# Patient Record
Sex: Male | Born: 1935 | Race: White | Hispanic: No | Smoking: Former smoker
Health system: Southern US, Community
[De-identification: ages and names within clinical notes are randomized; demographics above are authoritative.]

## PROBLEM LIST (undated history)

## (undated) DIAGNOSIS — I739 Peripheral vascular disease, unspecified: Secondary | ICD-10-CM

## (undated) DIAGNOSIS — M79604 Pain in right leg: Secondary | ICD-10-CM

## (undated) DIAGNOSIS — E119 Type 2 diabetes mellitus without complications: Secondary | ICD-10-CM

## (undated) DIAGNOSIS — I8390 Asymptomatic varicose veins of unspecified lower extremity: Secondary | ICD-10-CM

## (undated) DIAGNOSIS — R600 Localized edema: Secondary | ICD-10-CM

## (undated) DIAGNOSIS — M109 Gout, unspecified: Secondary | ICD-10-CM

## (undated) DIAGNOSIS — I779 Disorder of arteries and arterioles, unspecified: Secondary | ICD-10-CM

## (undated) DIAGNOSIS — K219 Gastro-esophageal reflux disease without esophagitis: Secondary | ICD-10-CM

## (undated) DIAGNOSIS — I4891 Unspecified atrial fibrillation: Secondary | ICD-10-CM

## (undated) DIAGNOSIS — M4800 Spinal stenosis, site unspecified: Secondary | ICD-10-CM

## (undated) DIAGNOSIS — M79605 Pain in left leg: Secondary | ICD-10-CM

## (undated) DIAGNOSIS — J449 Chronic obstructive pulmonary disease, unspecified: Secondary | ICD-10-CM

## (undated) DIAGNOSIS — I2510 Atherosclerotic heart disease of native coronary artery without angina pectoris: Secondary | ICD-10-CM

## (undated) DIAGNOSIS — N289 Disorder of kidney and ureter, unspecified: Secondary | ICD-10-CM

## (undated) DIAGNOSIS — N62 Hypertrophy of breast: Secondary | ICD-10-CM

## (undated) DIAGNOSIS — E785 Hyperlipidemia, unspecified: Secondary | ICD-10-CM

## (undated) HISTORY — DX: Unspecified atrial fibrillation: I48.91

## (undated) HISTORY — DX: Type 2 diabetes mellitus without complications: E11.9

## (undated) HISTORY — DX: Atherosclerotic heart disease of native coronary artery without angina pectoris: I25.10

## (undated) HISTORY — PX: EYE SURGERY: SHX253

## (undated) HISTORY — DX: Spinal stenosis, site unspecified: M48.00

## (undated) HISTORY — DX: Asymptomatic varicose veins of unspecified lower extremity: I83.90

## (undated) HISTORY — DX: Gastro-esophageal reflux disease without esophagitis: K21.9

## (undated) HISTORY — PX: CHOLECYSTECTOMY: SHX55

## (undated) HISTORY — DX: Disorder of arteries and arterioles, unspecified: I77.9

## (undated) HISTORY — DX: Chronic obstructive pulmonary disease, unspecified: J44.9

## (undated) HISTORY — DX: Gout, unspecified: M10.9

## (undated) HISTORY — DX: Hyperlipidemia, unspecified: E78.5

## (undated) HISTORY — DX: Pain in right leg: M79.604

## (undated) HISTORY — DX: Localized edema: R60.0

## (undated) HISTORY — DX: Pain in left leg: M79.605

## (undated) HISTORY — PX: HERNIA REPAIR: SHX51

## (undated) HISTORY — DX: Peripheral vascular disease, unspecified: I73.9

## (undated) HISTORY — DX: Disorder of kidney and ureter, unspecified: N28.9

## (undated) HISTORY — DX: Hypertrophy of breast: N62

---

## 2001-01-15 ENCOUNTER — Ambulatory Visit (HOSPITAL_COMMUNITY): Admission: RE | Admit: 2001-01-15 | Discharge: 2001-01-15 | Payer: Self-pay | Admitting: Cardiology

## 2005-02-06 ENCOUNTER — Ambulatory Visit: Payer: Self-pay | Admitting: Cardiology

## 2005-05-08 ENCOUNTER — Ambulatory Visit: Payer: Self-pay | Admitting: Cardiology

## 2005-05-27 ENCOUNTER — Ambulatory Visit: Payer: Self-pay | Admitting: Cardiology

## 2005-10-06 ENCOUNTER — Ambulatory Visit: Payer: Self-pay | Admitting: Cardiology

## 2006-02-18 ENCOUNTER — Ambulatory Visit: Payer: Self-pay | Admitting: Cardiology

## 2006-03-03 ENCOUNTER — Ambulatory Visit: Payer: Self-pay | Admitting: Cardiology

## 2006-03-04 ENCOUNTER — Ambulatory Visit: Payer: Self-pay | Admitting: Cardiology

## 2006-03-11 ENCOUNTER — Ambulatory Visit: Payer: Self-pay | Admitting: Cardiology

## 2006-03-20 ENCOUNTER — Ambulatory Visit: Payer: Self-pay | Admitting: Cardiology

## 2007-03-01 ENCOUNTER — Ambulatory Visit: Payer: Self-pay | Admitting: Cardiology

## 2007-03-19 ENCOUNTER — Ambulatory Visit: Payer: Self-pay | Admitting: Cardiology

## 2007-03-26 ENCOUNTER — Ambulatory Visit: Payer: Self-pay | Admitting: Cardiology

## 2007-04-26 ENCOUNTER — Ambulatory Visit: Payer: Self-pay | Admitting: Cardiology

## 2007-06-10 ENCOUNTER — Ambulatory Visit: Payer: Self-pay | Admitting: Cardiology

## 2007-09-20 ENCOUNTER — Ambulatory Visit: Payer: Self-pay | Admitting: Cardiology

## 2009-02-14 ENCOUNTER — Encounter: Payer: Self-pay | Admitting: Cardiology

## 2009-03-08 ENCOUNTER — Encounter: Payer: Self-pay | Admitting: Cardiology

## 2009-04-04 ENCOUNTER — Ambulatory Visit: Payer: Self-pay | Admitting: Cardiology

## 2009-04-11 ENCOUNTER — Encounter: Payer: Self-pay | Admitting: Physician Assistant

## 2009-06-13 ENCOUNTER — Encounter: Payer: Self-pay | Admitting: Cardiology

## 2009-09-23 ENCOUNTER — Encounter: Payer: Self-pay | Admitting: Cardiology

## 2009-09-24 ENCOUNTER — Ambulatory Visit: Payer: Self-pay | Admitting: Cardiology

## 2009-09-25 ENCOUNTER — Encounter: Payer: Self-pay | Admitting: Cardiology

## 2009-09-27 ENCOUNTER — Encounter: Payer: Self-pay | Admitting: Cardiology

## 2010-08-07 ENCOUNTER — Ambulatory Visit: Payer: Self-pay | Admitting: Cardiology

## 2010-08-07 DIAGNOSIS — K219 Gastro-esophageal reflux disease without esophagitis: Secondary | ICD-10-CM

## 2010-08-07 DIAGNOSIS — N62 Hypertrophy of breast: Secondary | ICD-10-CM

## 2010-08-07 DIAGNOSIS — E119 Type 2 diabetes mellitus without complications: Secondary | ICD-10-CM

## 2010-08-13 ENCOUNTER — Encounter: Payer: Self-pay | Admitting: Cardiology

## 2010-08-16 ENCOUNTER — Encounter (INDEPENDENT_AMBULATORY_CARE_PROVIDER_SITE_OTHER): Payer: Self-pay | Admitting: *Deleted

## 2010-10-29 NOTE — Letter (Signed)
Summary: Engineer, materials at [facility]  [address]   [place], [place] [address]   Phone: [phone]  Fax: [phone]        August 16, 2010 MRN: [id number]    Reginald [name] [address] [place], [place]  [address]    Dear [name],  Your test ordered by Selena Batten has been reviewed by your physician (or physician assistant) and was found to be normal or stable. Your physician (or physician assistant) felt no changes were needed at this time.  ____ Echocardiogram  ____ Cardiac Stress Test  ____ Lab Work  _X___ Peripheral vascular study of neck  ____ CT scan or X-ray  ____ Lung or Breathing test  ____ Other:   Thank you.   Cyril Loosen, RN, BSN    Duane Boston, M.D., F.A.C.C. Thressa Sheller, M.D., F.A.C.C. Oneal Grout, M.D., F.A.C.C. Cheree Ditto, M.D., F.A.C.C. Daiva Nakayama, M.D., F.A.C.C. Kenney Houseman, M.D., F.A.C.C. Jeanne Ivan, PA-C

## 2010-10-29 NOTE — Assessment & Plan Note (Signed)
Summary: 77 YR FUL   Visit Type:  Follow-up Primary Provider:  Lita Mains  CC:  CAD.  History of Present Illness: Invasion is seen for cardiology followup.  I saw him last in July, 77 2010.  He has known coronary disease.  He has total occlusion of his proximal LAD.  He has had no ischemia.  Is not having any significant chest pain.  He is feeling well.  I have reviewed all of his medical chart.  The patient does have carotid disease.  I see now that he has not had a carotid Doppler since 2006.  This will need to be repeated.  Preventive Screening-Counseling & Management  Alcohol-Tobacco     Smoking Status: quit     Year Quit: 07/2009  Current Medications (verified): 1)  Allopurinol 100 Mg Tabs (Allopurinol) .... Take 1 Tablet By Mouth Once A Day 2)  Furosemide 40 Mg Tabs (Furosemide) .... Take 1 Tablet By Mouth Two Times A Day 3)  Glyburide 2.5 Mg Tabs (Glyburide) .... Take 1 Tablet By Mouth Every Morning 4)  Lisinopril 10 Mg Tabs (Lisinopril) .... Take 1 Tablet By Mouth Once A Day 5)  Metoprolol Tartrate 100 Mg Tabs (Metoprolol Tartrate) .... One Tablet By Mouth Twice A Day 6)  Potassium Chloride Crys Cr 20 Meq Cr-Tabs (Potassium Chloride Crys Cr) .... Take One Tablet By Mouth Daily 7)  Aspirin 81 Mg Tabs (Aspirin) .... Take 1 Tablet By Mouth Once A Day 8)  Fish Oil 1000 Mg Caps (Omega-3 Fatty Acids) .... Take 1 Tablet By Mouth Three Times A Day 9)  Isosorbide Mononitrate Cr 60 Mg Xr24h-Tab (Isosorbide Mononitrate) .... Take 1 Tablet By Mouth Once A Day 10)  Tricor 145 Mg Tabs (Fenofibrate) .... Take 1 Tablet By Mouth Once A Day 11)  Multivitamins  Tabs (Multiple Vitamin) .... Take 1 Tablet By Mouth Once A Day 12)  Oscal 500/200 D-3 500-200 Mg-Unit Tabs (Calcium Carbonate-Vitamin D) .... Take 1 Tablet By Mouth Two Times A Day 13)  Zantac 150 Mg Tabs (Ranitidine Hcl) .... Take 1 Tablet By Mouth Two Times A Day 14)  Warfarin Sodium 5 Mg Tabs (Warfarin Sodium) .... Use As Directed Per  [facility] 15)  Colestipol Hcl 1 Gm Tabs (Colestipol Hcl) .... Take 1 Tablet By Mouth Two Times A Day 16)  Welchol 3.75 Gm Pack (Colesevelam Hcl) .... Dissolve One Packet in [employer] Daily  Allergies (verified): 1)  ! Morphine  Comments:  Nurse/Medical Assistant: The patient's medication bottles and allergies were reviewed with the patient and were updated in the Medication and Allergy Lists.  Past History:  Past Medical History: GERD CAD    catheterization 2002, total LAD.Marland Kitchen. single-vessel disease C O P D Gouty arthritis Spinal stenosis Tobacco abuse Dyslipidemia.. Renal insufficiency Diabetes  Gynecomastia   chronic Edema   lower extremity... chronic  Asthma EF 55-60% echo May, 2008 ( EF had been 35% previously)... stress echo 2009.. no definite ischemia.. question of increased LVOT velocity of 3 m/s not recorded RICA, 60-79% LICA Carotid artery disease  40-59% RICA, 60-79% LICA  ( 2006)  Social History: Smoking Status:  quit  Review of Systems       Patient denies fever, chills, headache, sweats, rash, change in vision, change in hearing, cough, nausea vomiting, urinary symptoms.  All other systems are reviewed and are negative.  Vital Signs:  Patient profile:   77 year old male Height:      72 inches Weight:  220 pounds BMI:     29.95 Pulse rate:   77 / minute BP sitting:   102 / 64  (left arm) Cuff size:   large  Vitals Entered By: Carlye Grippe (August 07, 2010 10:31 AM)  Nutrition Counseling: Patient's BMI is greater than 25 and therefore counseled on weight management options.  Physical Exam  General:  patient is stable today. Head:  head is atraumatic. Eyes:  no xanthelasma. Neck:  no jugular venous distention. Chest Wall:  no chest wall tenderness. Lungs:  lungs are clear respiratory effort is not labored. Heart:  cardiac exam reveals an S1-S2.  No clicks or significant murmurs. Abdomen:  abdomen is soft. Msk:  no musculoskeletal  deformities. Extremities:  no peripheral edema. Skin:  no skin rashes. Psych:  patient is oriented to person time and place.  Affect is normal.   Impression & Recommendations:  Problem # 1:  CAROTID ARTERY DISEASE (ICD-433.10) review of records show that the patient has significant carotid disease. I am scheduling him for a followup carotid Doppler.  Problem # 2:  * QUESTION LEFT VENTRICULAR OUTFLOW INCREASED VELOCITY There was question of an increased velocity at the time of his last echo.  However the study was technically difficult.  He does not need a followup echo at this time but will be kept in mind.  Problem # 3:  EDEMA (ICD-782.3) There is no significant edema at this time.  No further workup.  Problem # 4:  DYSLIPIDEMIA (ICD-272.4) Lipids are treated.  No change in therapy. The following medications were removed from the medication list:    Simvastatin 80 Mg Tabs (Simvastatin) .Marland Kitchen... Take one tablet by mouth daily at bedtime His updated medication list for this problem includes:    Tricor 145 Mg Tabs (Fenofibrate) .Marland Kitchen... Take 1 tablet by mouth once a day    Colestipol Hcl 1 Gm Tabs (Colestipol hcl) .Marland Kitchen... Take 1 tablet by mouth two times a day    Welchol 3.75 Gm Pack (Colesevelam hcl) .Marland Kitchen... Dissolve one packet in 4-8oz of water daily  Problem # 5:  TOBACCO ABUSE (ICD-305.1) The patient stopped smoking in 2010.  Problem # 6:  CAD (ICD-414.00) Coronary disease is stable.  EKG is done today and reviewed by me.  There is sinus rhythm.  There is no significant change.  No further cardiac workup is needed.  Carotid Doppler will be scheduled in all seen back in one year for followup.  Other Orders: EKG w/ Interpretation (93000) Carotid Duplex (Carotid Duplex)  Patient Instructions: 1)  Your physician wants you to follow-up in: 1 year. You will receive a reminder letter in the mail one-two months in advance. If you don't receive a letter, please call our office to schedule the  follow-up appointment. 2)  Your physician recommends that you continue on your current medications as directed. Please refer to the Current Medication list given to you today. 3)  Your physician has requested that you have a carotid duplex. This test is an ultrasound of the carotid arteries in your neck. It looks at blood flow through these arteries that supply the brain with blood. Allow one hour for this exam. There are no restrictions or special instructions. If the results of your test are normal or stable, you will receive a letter. If they are abnormal, the nurse will contact you by phone.

## 2010-10-29 NOTE — Miscellaneous (Signed)
  Clinical Lists Changes  Problems: Added new problem of GERD (ICD-530.81) Added new problem of CAD (ICD-414.00) Added new problem of COPD (ICD-496) Added new problem of TOBACCO ABUSE (ICD-305.1) Added new problem of DYSLIPIDEMIA (ICD-272.4) Added new problem of RENAL INSUFFICIENCY (ICD-588.9) Added new problem of DM (ICD-250.00) Added new problem of GYNECOMASTIA (ICD-611.1) Added new problem of EDEMA (ICD-782.3) Added new problem of * EF 55% Added new problem of * QUESTION LEFT VENTRICULAR OUTFLOW INCREASED VELOCITY Added new problem of CAROTID ARTERY DISEASE (ICD-433.10) Observations: Added new observation of PAST MED HX: GERD CAD    catheterization 2002, total LAD.Marland Kitchen. single-vessel disease C O P D Gouty arthritis Spinal stenosis Tobacco abuse Dyslipidemia Renal insufficiency Diabetes  Gynecomastia   chronic Edema   lower extremity... chronic  Asthma EF 55-60% echo May, 2008 ( EF had been 35% previously)... stress echo 2009.. no definite ischemia.. question of increased LVOT velocity of 3 m/s not recorded RICA, 60-79% LICA Carotid artery disease  40-59% RICA, 60-79% LICA  ( 2006) (08/07/2010 7:56)       Past History:  Past Medical History: GERD CAD    catheterization 2002, total LAD.Marland Kitchen. single-vessel disease C O P D Gouty arthritis Spinal stenosis Tobacco abuse Dyslipidemia Renal insufficiency Diabetes  Gynecomastia   chronic Edema   lower extremity... chronic  Asthma EF 55-60% echo May, 2008 ( EF had been 35% previously)... stress echo 2009.. no definite ischemia.. question of increased LVOT velocity of 3 m/s not recorded RICA, 60-79% LICA Carotid artery disease  40-59% RICA, 60-79% LICA  ( 2006)

## 2011-01-27 ENCOUNTER — Other Ambulatory Visit: Payer: Self-pay | Admitting: *Deleted

## 2011-01-27 MED ORDER — POTASSIUM CHLORIDE CRYS ER 20 MEQ PO TBCR: 20.0000 meq | EXTENDED_RELEASE_TABLET | Freq: Every day | ORAL | Status: DC

## 2011-02-11 NOTE — Assessment & Plan Note (Signed)
[facility]                          [facility] NOTE   NAME:Daffron, NAM VOSSLER                      MRN:          [id number]  DATE:09/20/2007                            DOB:          08-Oct-1929    Mr. Sibert returns today and I am pleased that he is actually feeling  well. He thinks his edema problem is gone. He is not having chest pain.  He is not having any significant shortness of breath.   PAST MEDICAL HISTORY:   ALLERGIES:  No known drug allergies.   MEDICATIONS:  1. Aspirin.  2. Potassium.  3. Lisinopril.  4. Glyburide.  5. Imdur.  6. Zantac.  7. Lasix.  8. Allopurinol.  9. Fish oil.  10.Metoprolol.   OTHER MEDICAL PROBLEMS:  See the complete list on the note of April 26, 2007.   REVIEW OF SYSTEMS:  He is feeling well today.   PHYSICAL EXAMINATION:  His weight is up, but it is not fluid. His weight  today is 230 pounds. Blood pressure 123/73. The patient's rate is 81.  The patient is oriented to person, time and place. Affect is normal.  HEENT: Reveals no xanthelasma. He has normal extraocular motion. There  are no carotid bruits. There is no jugular venous distention.  LUNGS:  Are clear. Respiratory effort is not labored.  CARDIAC: Reveals an S1, with an S2. There are no clicks or significant  murmurs.  ABDOMEN: Soft.  He has no significant peripheral edema.   There are no labs done today.   PROBLEM LIST:  Are listed on the note of April 26, 2007. His coronary  disease is stable. His edema is stable. His renal status is stable. We  have written some prescriptions for him to help with his overall care.  He is considering a new primary care physician. If this occurs, he will  let us  know who it is and we will send information about him to be sure that  that doctor has all of his records.   He is stable today.     Luis Abed, MD, [facility]  Electronically Signed    JDK/MedQ  DD: 09/20/2007  DT: 09/20/2007   Job #: [id number]

## 2011-02-11 NOTE — Assessment & Plan Note (Signed)
[facility]                          [facility] NOTE   NAME:[name], Reginald [name]                      MRN:          [id number]  DATE:03/01/2007                            DOB:          1929-12-18    Reginald [name] is seen for cardiology followup. He was seen last in the  office in June of 2007. There is an extensive review by Newt Minion at  that time. There is a very careful review of the evaluation of the blood  flow in his feet and his volume status. He has had some peripheral edema  and it was felt at that time that it was not on the basis of a major  cardiac issue. Consideration was given to small doses of Zaroxolyn at  that point. Over time, the patient has not had edema most recently in  his legs, other than his ankles and feet. However, he is quite bothered  by this. It does not go away at night completely and he is concerned.   The patient is not having any significant chest pain. He does have some  exertional shortness of breath. He has known coronary disease.   Ejection fraction in June of 2007, was 50%. He had an echo done on January 08, 2007, through Dr.  Beatriz Stallion office. That was read as an ejection  fraction of 45%. I explained to him that this was not a significant  difference.   PAST MEDICAL HISTORY:   ALLERGIES:  No known drug allergies.   MEDICATIONS:  1. Metoprolol 50 t.i.d.  2. Aspirin 325.  3. Potassium 20.  4. Lisinopril 10.  5. Glyburide 2.5.  6. Imdur 60.  7. Zantac.  8. Lasix 80 b.i.d.  9. Hydrochlorothiazide 25.  10.Diclofenac 75.  11.Colchicine 0.6 two tablets daily.   OTHER MEDICAL PROBLEMS:  See the list below.   REVIEW OF SYSTEMS:  His major complaints do relate to his swelling in  his feet. See the HPI. Otherwise, his review of systems is negative.   PHYSICAL EXAMINATION:  Blood pressure 112/63, with a pulse of 70. Weight  is 225 pounds, which is similar to the prior year.  HEENT: Reveals no  xanthelasma. There is normal extraocular motion. His  conjunctivae are normal.  There are no carotid bruits. There is no jugular venous distention.  LUNGS:  Reveal the question of a basilar rale or two.  CARDIAC: Reveals an S1, with an S2. There are no clicks or significant  murmurs.  ABDOMEN: Soft. There are no masses or bruits.  The patient does have trace to 1+ peripheral edema in his feet and  ankles. There are no major musculoskeletal deformities.   EKG reveals sinus rhythm with first-degree AV block and left axis.   PROBLEMS:  1. Coronary disease. He is not currently having any significant      angina.  2. Ischemic cardiomyopathy with an ejection fraction in the 45% most      recently documented on January 08, 2007.  3. Gastroesophageal reflux disease.  4. Diabetes.  5. Hyperlipidemia, on medication.  6. Depression.  7.  Chronic obstructive pulmonary disease.  8. History of chronic gynecomastia.  9. History of gouty arthritis.  10.Continued concern about his abdominal girth, but ascites has not      been proven in the past. He had an ultrasound in August of 2006.  11.Chronic back pain with spondylosis and spinal stenosis.  12.Lower extremity edema. We think it is multi-factorial. He does have      lumbar disc disease. He has arterial disease from his diabetes.      There is diabetic neuropathy and he has gout and venous      insufficiency. All of these issues are playing a role.  13.Renal insufficiency. His creatinine is in the range of 1.4 a year      ago and his labs will be repeated.   I have chosen today to check a CBC, BMET and a TSH. We will add 25 mg  spironolactone. Very careful attention will be played to his potassium.  I will personally see him back within several weeks to reassess his  status.     Luis Abed, MD, [facility]  Electronically Signed    JDK/MedQ  DD: 03/01/2007  DT: 03/01/2007  Job #: [id number]   cc:   Erasmo Downer, MD

## 2011-02-11 NOTE — Assessment & Plan Note (Signed)
[facility]                          [facility] NOTE   NAME:Reginald [name], GABINO HAGIN                      MRN:          [id number]  DATE:03/26/2007                            DOB:          1929/12/13    Mr. Davinder Haff is seen for cardiology followup.  See my complete note  of March 19, 2007.  See also the extensive problem list on March 01, 2007.   Mr. Wandler returns.  I had been worried about his renal function.  We  cut back on his diuretics.  He had been dosed as  high as 120 Lasix in  the morning and 80 in the evening.  Most recently his creatinine was  over 2.  We backed off on his diuretics and his creatinine is now down  to 0.9.  Along with this he has gained 7 pounds.  I believe he was dry  when I saw him last due to diarrhea in addition to his diuretics.  I  believe now he is close to stable or maybe a pound or two above where he  needs to be.  His Lasix will be readjusted to 80 in the morning and 40  in the evening.   The patient also mentions that he has a gout flare today.  He is on  colchicine.  He is not on allopurinol at this time and this will be  started after his gout flare has resolved.   PAST MEDICAL HISTORY:   ALLERGIES:  No known drug allergies. Patient responded very poorly to  SPIRONOLACTONE  with diarrhea.   MEDICATIONS:  1. Metoprolol.  2. Aspirin.  3. Lisinopril.  4. Glyburide.  5. Imdur.  6. Lasix dose to be changed to 80 in the morning and 40 in the      evening.  7. Hydrochlorothiazide 25.  8. Diclofenac.  9. Colchicine.   OTHER MEDICAL PROBLEMS:  See the extensive list from before.   REVIEW OF SYSTEMS:  His main problem today is gout.  Otherwise review of  systems is negative.  He is not particularly short of breath despite his  current volume status.   PHYSICAL EXAMINATION:  Blood pressure is 121/70.  Pulse is 63.  The  patient's weight is 227.8 pounds.  The patient is oriented to person, time and  place.  Affect is at  baseline for him.  He of course is unhappy that he has a gout flare.  HEENT:  Reveals no xanthelasma.  He has normal extraocular motion.  There are no carotid bruits.  There is no jugular venous distention.  LUNGS:  Clear. Respiratory effort is not labored.  CARDIAC:  Reveals an S1 with an S2.  There are no clicks or significant  murmurs.  ABDOMEN:  Soft.  He has normal bowel sounds.  He does have 1+ peripheral edema today.  As mentioned he has gained  weight back.   His followup creatinine is 0.90 which is improved for him.   PROBLEMS:  Problems are listed extensively on my note of March 01, 2007.  Problem #12 - lower extremity edema.  See the discussion above.  Adjusted his dose to be adjusted to 80 of Lasix in the morning and 40 in  the evening.  Problem #13 - renal insufficiency.  This improved to a creatinine of  0.9.  We will recheck his BMET in approximately 10 days.  Problem #9 - gouty arthritis.  See the discussion above.  We will put  him on 1 week of Indocin at 50 t.i.d. His allopurinol will be started at  low dose after his flare is resolved.  We will also give him a short  course of some pain medicines.  I made it clear to him that I am trying  to help him with all of his problems the best I can while I am seeing  him for his heart problems.     Luis Abed, MD, [facility]  Electronically Signed    JDK/MedQ  DD: 03/26/2007  DT: 03/26/2007  Job #: [id number]   cc:   Erasmo Downer, MD

## 2011-02-11 NOTE — Assessment & Plan Note (Signed)
[facility]                          [facility] NOTE   NAME:Reginald [name], Reginald [name]                      MRN:          [id number]  DATE:06/10/2007                            DOB:          1930-08-16    Mr. Reginald [name] is here for followup.  See all of my extensive notes.  Most  recently, I saw him on April 26, 2007 with a complete review of his  issues.  He is doing well.  After he left, approximately a week after  his last visit, he noticed that he was urinating more frequently and  that his edema was under good control, and he thought that it would be  appropriate to cut back on his evening Lasix dose.  He did this and  fortunately, he has done quite well and remained stable.  His weight has  remained the same.  He is doing well.  He is as active as he can be.  He  does have some swelling as the day goes on.  Once again, I have re-  explained to him that this is an issue of his venous insufficiency and  not a sign any more of total body volume overload.   PAST MEDICAL HISTORY:   ALLERGIES:  No known drug allergies.   MEDICATIONS:  1. Metoprolol.  2. Aspirin.  3. Potassium.  4. Lisinopril.  5. Glyburide.  6. Imdur.  7. Zantac.  8. Hydrochlorothiazide.  9. Diclofenac.  10.Colchicine.  11.Lasix 80 in the morning.   OTHER MEDICAL PROBLEMS:  See the extensive list on the note of April 26, 2007.   REVIEW OF SYSTEMS:  He mentions that he has a cough that occurs in the  morning, but after coughing some, this clears and he has no further  problems during the day.  I suspect this is from some chronic  bronchitis.  He does not appear to be decompensated in this regard, and  he does not produce sputum with this.  Otherwise, his review of systems  is negative.   PHYSICAL EXAM:  The patient is oriented to person, time, and place.  His  affect reveals that he gets angry easily.  He was asked today for his  medication list, and he said that he had  not been asked for that before,  and he became upset.  He has many complaints for [place].  However, I mention  to him that he and I need to work together so that I can continue to  take care of him long term.  HEENT:  Reveals no xanthelasma.  He has normal extraocular motions.  There are no carotid bruits.  There is no jugular venous distension.  LUNGS:  Reveal scattered rhonchi.  There is no respiratory distress.  CARDIAC:  Reveals an S1 with an S2.  There are no clicks or significant  murmurs.  ABDOMEN:  Soft.  He has normal bowel sounds.  He has trace to 1+ peripheral edema.  He has varicosities.  This is  unchanged from the past.   The patient had had followup labs done on June 03, 2007.  His BUN  was down to 14 and creatinine 1.1, and this is excellent for him.  His  potassium was 5.0 and his potassium dose was adjusted.   PROBLEMS:  Listed on my note of April 26, 2007 and reviewed completely.  1. Lower extremity edema.  See the discussion above and this is      stable.  2. Renal insufficiency.  He had had a bump in his renal function.  It      continues to improve with his creatinine down to 1.1.  He will      remain on his current medicines.  3. Mild increase in potassium and his potassium dose has been      adjusted.  He is more stable now.  I will see him back in 3 months      for cardiology followup.     Reginald Abed, MD, [facility]  Electronically Signed    JDK/MedQ  DD: 06/10/2007  DT: 06/10/2007  Job #: [id number]   cc:   Reginald [name]

## 2011-02-11 NOTE — Assessment & Plan Note (Signed)
[facility]                          [facility] NOTE   Reginald [name], Reginald [name]                      MRN:          [id number]  DATE:04/04/2009                            DOB:          03/06/1930    PRIMARY CARDIOLOGIST:  Luis Abed, MD, [facility]   REASON FOR VISIT:  Annual followup.   [name] denies any interim development of signs or symptoms  suggestive of either unstable angina pectoris or congestive heart  failure.  He reports compliance with his medications, and states that he  has had his lipid profile checked recently, per Dr. Olena Leatherwood.  No current  data is available, however.   Unfortunately, [name] continues to smoke, albeit cigars.   CURRENT MEDICATIONS:  1. Aspirin 325 daily.  2. KCl 20 daily.  3. Lisinopril 10 daily.  4. Glyburide 2.5 daily.  5. Imdur 60 daily.  6. Zantac 300 daily.  7. Allopurinol 100 daily.  8. Fish oil 1 g t.i.d.  9. Metoprolol tartrate 100 b.i.d.  10.Furosemide 80 q.a.m./40 q.p.m.  11.Simvastatin 40 daily.  12.TriCor 145 daily.   PHYSICAL EXAMINATION:  VITAL SIGNS:  Blood pressure is 125/66, pulse is  65, regular, and weight is 226 (down 4).  GENERAL:  A 77 year old male sitting upright in no distress.  HEENT:  Normocephalic, atraumatic.  NECK:  Palpable carotid pulse without bruits; no JVD at 90 degrees.  LUNGS:  Diminished breath sounds at bases, with initial mild crackles in  the right base, cleared following deep inspiration.  No wheezes.  HEART:  Regular rate and rhythm.  No significant murmurs.  No rubs.  ABDOMEN:  Soft and nontender.  EXTREMITIES:  1+ bilateral lower extremity edema.  NEURO:  Flat affect, but no focal deficit.   IMPRESSION:  1. Ischemic cardiomyopathy.      a.     History of severe, single-vessel CAD with known 100%       occluded proximal LAD, by catheterization in 2002.      b.     Initial EF 35%; normalized (55-60%) with no focal wall       motion  abnormality, by 2-D echo, May 2008.      c.     Non-ischemic adenosine Cardiolite with extensive       anteroapical scar; EF 44%, August 2005.  2. COPD/ongoing tobacco.  3. Dyslipidemia.  4. History of renal insufficiency.  5. Type 2 diabetes mellitus.  6. Chronic gynecomastia.  7. Chronic lower extremity edema.   PLAN:  1. Down titrate aspirin to 81 mg daily, to be taken indefinitely.      Otherwise continue current medications.  2. Aggressive lipid management, per Dr. Olena Leatherwood, with target LDL of 70      or less, if feasible.  We will request most recent lipid profile      data from Dr. Bartholomew Crews office.  3. Return clinic followup with Dr. Myrtis Ser in 1 year.      Rozell Searing, PA-C  Electronically Signed      Luis Abed, MD, [facility]  Electronically Signed   GS/MedQ  DD: 04/04/2009  DT: 04/05/2009  Job #: [id number]   cc:   Lia Hopping

## 2011-02-11 NOTE — Assessment & Plan Note (Signed)
[facility]                          [facility] NOTE   NAME:Reginald [name], Reginald [name]                      MRN:          [id number]  DATE:04/26/2007                            DOB:          May 23, 1930    [name] returns for followup.   I saw him last in the office on March 26, 2007.  At that time, his edema  appeared to be stable.  His renal insufficiency had stabilized.  He did  appear to have gouty arthritis.  He was treated with some Indocin, and  he is feeling better.  He still has discomfort in his feet with walking.  I believe it is most likely that this is due to his diabetes.  He  mentioned that his glucose is in the 160 range and I have asked him to  see Dr. Linna Darner for followup of his diabetes.   He has not been having any significant chest pain.  He does have some  mild shortness of breath at night and a mild cough.  This sounds more  like a chronic smoker's bronchitic cough.  It does not sound like PND or  orthopnea.   PAST MEDICAL HISTORY:   ALLERGIES:  No known drug allergies.   MEDICATIONS:  1. Metoprolol 50 t.i.d.  2. Aspirin 325.  3. Potassium 20 mEq daily.  4. Lisinopril 10.  5. Glyburide 2.5.  6. Imdur 60.  7. Hydrochlorothiazide 25.  8. Diclofenac.  9. Colchicine.  10.Lasix 80 in the morning and 40 in the evening.   OTHER MEDICAL PROBLEMS:  See the complete list below.   REVIEW OF SYSTEMS:  At this point he is doing relatively well, although  he is bothered by his chronic discomfort in his feet and this has been  well assessed in the past.  He mentioned as above that his glucose has  been 160.  And, I have encouraged followup with Dr. Linna Darner.  Otherwise  review of systems is negative.   PHYSICAL EXAMINATION:  VITAL SIGNS:  Blood pressure today is 105/59,  with a pulse of 69.  His weight is 224 pounds and this is decreased 2  pounds since his last visit.  GENERAL:  Overall he is stable.  NEUROLOGIC:  The patient  is oriented to person, time, and place.  Affect  is usual for him.  At times when he asks questions, he looks forward to  a very rapid answer and with that answer he generally considers it and  seems to understand well.  HEENT:  Reveals no xanthelasma. He has normal extraocular motion.  NECK:  There are no carotid bruits.  There is no jugular venous  distention.  LUNGS:  Today, reveal a few scattered rhonchi.  There is no shortness of  breath at rest today.  CARDIAC:  Reveals an S1 with an S2.  There are no clicks or significant  murmurs.  ABDOMEN:  Soft.  He has normal bowel sounds.  He has only trivial  peripheral edema at this time.   He has had his renal function followed carefully.  At one point  his  creatinine had gone up as high as 2.1.  It then was as low as 0.9 and it  in followup it has been 1.2 and most recently on April 22, 2007, his  creatinine was 1.4 with a BUN of 25.  This is acceptable for him and we  will watch him carefully.   PROBLEM LIST:  1. Coronary disease.  He does not have significant angina.  2. Ischemic cardiomyopathy.  Ejection fraction 45% most recently      documented in April 2008.  3. Gastroesophageal reflux disease.  4. Diabetes.  5. Hyperlipidemia.  6. Depression.  7. Chronic obstructive pulmonary disease.  8. History of chronic gynecomastia.  9. History of gouty arthritis.  10.Some concern about his abdominal girth in the past but he has not      had ascites and this is stabilized.  11.Chronic back pain with spondylosis and spinal stenosis.  12.Lower extremity edema.  This appears to be stable.  13.Discomfort in his feet.  I believe this is related to his diabetic      disease.  14.Renal insufficiency.  Creatinine of 1.4 at this time seems still      appropriate for him.  His meds will not be changed.  We will      recheck a B-MET in 5 weeks and I will see him in 6 weeks.     Luis Abed, MD, [facility]  Electronically Signed    JDK/MedQ   DD: 04/26/2007  DT: 04/27/2007  Job #: [id number]   cc:   Erasmo Downer, MD

## 2011-02-11 NOTE — Assessment & Plan Note (Signed)
[facility]                          [facility] NOTE   NAME:Ingham, AHMIR BRACKEN                      MRN:          [id number]  DATE:03/19/2007                            DOB:          1930/03/05    Mr. Acevedo is seen for followup.  See my complete note of March 01, 2007.  We started low dose spironolactone.  The patient had marked diarrhea and  stopped the spironolactone.  He actually has had significant weight  loss.  He did have a followup BMET and his creatinine has gone up to 2.1  and BUN to 56.  We will decrease his diuretics significantly and recheck  a BMET and see him back in one week.   PAST MEDICAL HISTORY:  ALLERGIES:  NO KNOWN DRUG ALLERGIES.   MEDICATIONS:  See the prior medication list.   REVIEW OF SYSTEMS:  Swelling in his feet continues to be the problem,  but I am not convinced that this is total body volume.  He does need to  elevate his feet at night.   PHYSICAL EXAMINATION:  Weight is 220 pounds.  Blood pressure is 99/54.  The patient is oriented to person, time, and place and his affect is his  usual affect.  HEENT:  Reveals no xanthelasma.  He has normal extraocular motion.  There are no carotid bruits.  There is no jugular venous distention.  LUNGS:  Reveal no rales but he has a few end expiratory wheezes.  He has  overall decreased breath sounds in general.  ABDOMEN:  Soft.  He has only trace peripheral edema at this time.   PROBLEMS:  Are listed extensively on my note of March 01, 2007.  1. Lower extremity edema.  As before we think that it is      multifactorial.  He has only trace edema today.  I believe that he      does have some venous insufficiency.  I believe that he is actually      dry at this point.  2. Renal insufficiency.  His creatinine went from 1.4 up to 2.1.  This      was related to his diarrhea and in diuretic use.  We will cut back      on his diuretics and check his BMET in one week and see him  in the      office in followup.  Initially I had told him to cut him Lasix only      to 80 in the morning and 40 in the evening.  With these changes we      will actually hold his Lasix for one day and then cut him down to      80 mg daily and then check his lab.     Luis Abed, MD, [facility]  Electronically Signed    JDK/MedQ  DD: 03/19/2007  DT: 03/19/2007  Job #: [id number]   cc:   Erasmo Downer, MD

## 2011-02-14 NOTE — Cardiovascular Report (Signed)
[facility]  Patient:    Reginald [name], Reginald [name]                     MRN: [id number]. Date: 01/15/01 Attending:  Rollene Rotunda, M.D. [facility] CC:         Pecola Lawless, M.D.  [facility], [place], [place]   Cardiac Catheterization  DATE OF BIRTH:  [birth year].  PRIMARY CARE PHYSICIAN:  Pecola Lawless, M.D.  PROCEDURE:  Left heart catheterization/coronary arteriography.  INDICATIONS:  Evaluate a patient with shortness of breath and congestive heart failure.  He has been found to have a reduced ejection fraction of 35% with anterior and anteroapical akinesis.  A perfusion study had suggested a previous anterior infarct with no evidence of ischemia.  PROCEDURAL NOTE:  Left heart catheterization was performed via the right femoral artery.  The artery was cannulated using an anterior wall puncture.  A 6-French arterial sheath was inserted via the modified Seldinger technique. Preformed Judkins and a pigtail catheter were utilized.  The patient tolerated the procedure well and left the lab in stable condition.  RESULTS:  Hemodynamics:  LV 123/21, AO 119/54.  Coronaries: 1. The left main had luminal irregularities. 2. The LAD was occluded proximally with scant circumflex to apical LAD and    right coronary artery to apical LAD collaterals. 3. The circumflex primarily consisted of a large mid obtuse marginal.  There    was proximal 25% stenosis.  There was a small branch off this mid obtuse    marginal with ostial 70% stenosis. 4. The right coronary artery was a large vessel with a long proximal 40%    stenosis.  There was long 25% stenosis after the PDA.  The PDA was a    medium sized vessel with mid 60% focal stenosis.  Left ventriculogram:  The left ventriculogram was obtained in the RAO and LAO projections.  The EF was 35% with severe anterior and anteroapical akinesis.  CONCLUSIONS:  Severe single-vessel coronary artery disease with  nonobstructive disease predominantly in the right coronary vessel.  PLAN:  The patient will continue to have medical management of his ischemic cardiomyopathy.  He will also continue to have aggressive secondary risk factor modification with an intention to have him stop smoking.  He will empirically be started on Zocor 20 mg q day and will have his cholesterol profile and liver enzymes followed in [place]. DD:  01/15/01 TD:  01/15/01 Job: [id number]

## 2011-04-24 ENCOUNTER — Telehealth: Payer: Self-pay | Admitting: Cardiology

## 2011-04-24 MED ORDER — LISINOPRIL 10 MG PO TABS: 10.0000 mg | ORAL_TABLET | Freq: Every day | ORAL | Status: DC

## 2011-04-24 NOTE — Telephone Encounter (Signed)
.   Requested Prescriptions   Signed Prescriptions Disp Refills  . lisinopril (PRINIVIL) 10 MG tablet 30 tablet 6    Sig: Take 1 tablet (10 mg total) by mouth daily.    Authorizing Provider: Myrtis Ser, JEFFREY D    Ordering User: Lacie Scotts

## 2011-08-15 ENCOUNTER — Encounter: Payer: Self-pay | Admitting: *Deleted

## 2011-08-25 ENCOUNTER — Encounter: Payer: Self-pay | Admitting: Cardiology

## 2011-08-25 ENCOUNTER — Ambulatory Visit (INDEPENDENT_AMBULATORY_CARE_PROVIDER_SITE_OTHER): Payer: Medicare Other | Admitting: Cardiology

## 2011-08-25 VITALS — BP 130/72 | HR 62 | Ht 72.0 in | Wt 238.0 lb

## 2011-08-25 DIAGNOSIS — R600 Localized edema: Secondary | ICD-10-CM | POA: Insufficient documentation

## 2011-08-25 DIAGNOSIS — Z720 Tobacco use: Secondary | ICD-10-CM | POA: Insufficient documentation

## 2011-08-25 DIAGNOSIS — I4891 Unspecified atrial fibrillation: Secondary | ICD-10-CM

## 2011-08-25 DIAGNOSIS — I2510 Atherosclerotic heart disease of native coronary artery without angina pectoris: Secondary | ICD-10-CM

## 2011-08-25 DIAGNOSIS — R0989 Other specified symptoms and signs involving the circulatory and respiratory systems: Secondary | ICD-10-CM

## 2011-08-25 DIAGNOSIS — E785 Hyperlipidemia, unspecified: Secondary | ICD-10-CM | POA: Insufficient documentation

## 2011-08-25 DIAGNOSIS — IMO0002 Reserved for concepts with insufficient information to code with codable children: Secondary | ICD-10-CM | POA: Insufficient documentation

## 2011-08-25 DIAGNOSIS — I779 Disorder of arteries and arterioles, unspecified: Secondary | ICD-10-CM

## 2011-08-25 DIAGNOSIS — Z7901 Long term (current) use of anticoagulants: Secondary | ICD-10-CM

## 2011-08-25 DIAGNOSIS — J449 Chronic obstructive pulmonary disease, unspecified: Secondary | ICD-10-CM | POA: Insufficient documentation

## 2011-08-25 DIAGNOSIS — R609 Edema, unspecified: Secondary | ICD-10-CM

## 2011-08-25 DIAGNOSIS — J45909 Unspecified asthma, uncomplicated: Secondary | ICD-10-CM | POA: Insufficient documentation

## 2011-08-25 DIAGNOSIS — R9430 Abnormal result of cardiovascular function study, unspecified: Secondary | ICD-10-CM

## 2011-08-25 DIAGNOSIS — N289 Disorder of kidney and ureter, unspecified: Secondary | ICD-10-CM | POA: Insufficient documentation

## 2011-08-25 DIAGNOSIS — I739 Peripheral vascular disease, unspecified: Secondary | ICD-10-CM

## 2011-08-25 NOTE — Progress Notes (Signed)
Addended by: Willa Rough D on: 08/25/2011 04:08 PM   Modules accepted: Level of Service

## 2011-08-25 NOTE — Assessment & Plan Note (Signed)
His most recent ejection fraction was in December, 2010.  At that time his EF was 45-50%.  He does not need a followup echo at this time.  I will plan to see him for cardiology followup in one year.

## 2011-08-25 NOTE — Assessment & Plan Note (Signed)
He did have atrial fibrillation documented in the hospital in 2010 and Coumadin was started at that time.  It is followed by his primary physician.  His rhythm today is normal sinus.

## 2011-08-25 NOTE — Progress Notes (Addendum)
HPI The patient is seen today for followup coronary artery disease.  I saw him last November, 2011.  He does have coronary disease.  He had a total LAD in 2002.  He has not had any significant chest pain.  He is not having any significant shortness of breath.  I saw him last with question of a prior carotid Doppler revealing some stenoses.  I arrange for carotid Doppler that was done on November 15,2011.  This study showed only minimal disease. I have back to review prior hospital notes because my records up to this point did not reflect his history of atrial fibrillation.  I found the consult note from his hospitalization in 2010 revealing that he had atrial fibrillation in the hospital.  It was at that time that decision was made to use Coumadin.  This is followed carefully by his primary physician.  As part of his evaluation I have reviewed all of my records and completely updated new electronic medical record   Allergies  Allergen Reactions  . Morphine     REACTION: hallucinations    Current Outpatient Prescriptions  Medication Sig Dispense Refill  . allopurinol (ZYLOPRIM) 100 MG tablet Take 100 mg by mouth daily.        Marland Kitchen aspirin EC 81 MG tablet Take 81 mg by mouth daily.        . calcium-vitamin D (OSCAL WITH D) 500-200 MG-UNIT per tablet Take 1 tablet by mouth 2 (two) times daily.        . Colesevelam HCl (WELCHOL) 3.75 G PACK Take 1 each by mouth daily.        . colestipol (COLESTID) 1 G tablet Take 1 g by mouth 2 (two) times daily.        . fenofibrate (TRICOR) 145 MG tablet Take 145 mg by mouth daily.        . fish oil-omega-3 fatty acids 1000 MG capsule Take 1 g by mouth 3 (three) times daily.        . furosemide (LASIX) 40 MG tablet Take 40 mg by mouth 2 (two) times daily.        Marland Kitchen glyBURIDE (DIABETA) 5 MG tablet Take 5 mg by mouth 2 (two) times daily with a meal.        . isosorbide mononitrate (IMDUR) 60 MG 24 hr tablet Take 60 mg by mouth daily.        Marland Kitchen lisinopril (PRINIVIL)  10 MG tablet Take 1 tablet (10 mg total) by mouth daily.  30 tablet  6  . metoprolol (LOPRESSOR) 100 MG tablet Take 100 mg by mouth 2 (two) times daily.        . Multiple Vitamin (MULTIVITAMIN) tablet Take 1 tablet by mouth daily.        . potassium chloride SA (K-DUR,KLOR-CON) 20 MEQ tablet Take 1 tablet (20 mEq total) by mouth daily.  30 tablet  6  . ranitidine (ZANTAC) 150 MG tablet Take 150 mg by mouth 2 (two) times daily.        Marland Kitchen warfarin (COUMADIN) 5 MG tablet Take 5 mg by mouth as directed. Per Dr. Olena Leatherwood office         History   Social History  . Marital Status: Widowed    Spouse Name: N/A    Number of Children: N/A  . Years of Education: N/A   Occupational History  . Not on file.   Social History Main Topics  . Smoking status: Former Smoker -- 0.8 packs/day  for 40 years    Types: Cigars    Quit date: 09/29/2008  . Smokeless tobacco: Never Used  . Alcohol Use: No  . Drug Use: Not on file  . Sexually Active: Not on file   Other Topics Concern  . Not on file   Social History Narrative  . No narrative on file    Family History  Problem Relation Age of Onset  . Diabetes Mother     Past Medical History  Diagnosis Date  . GERD (gastroesophageal reflux disease)   . CAD (coronary artery disease)     catheterization 2002,total LAD...single-vessel disease .Marland KitchenMarland KitchenEF 55-60% echo May,2008(EF had been 35% previously)...stress echo 2009...no definite ischemia..question of increased LVOT velocity of 16m/s not recorded RICA,60-79% LICA  . COPD (chronic obstructive pulmonary disease)   . Gouty arthritis   . Spinal stenosis   . Tobacco abuse   . Dyslipidemia   . Renal insufficiency   . Diabetes mellitus   . Gynecomastia     chronic  . Edema of lower extremity     chronic  . Asthma   . Carotid artery disease     40-59% RICA,60-79% LICA (2006)  . Ejection fraction     Previously 35%, /  EF 55-60%,Echo, 2008  /  stress echo 2009 question increased LVOT velocity 3 m/s but  not well recorded    Past Surgical History  Procedure Date  . Cholecystectomy     ROS    Patient denies fever, chills, headache and sweats no rash, change in vision, change in hearing, chest pain, cough, nausea vomiting, urinary symptoms.  All other systems are reviewed and are negative.  PHYSICAL EXAM  Patient is quite stable today.  He is oriented to person time and place.  Affect is normal.  Head is atraumatic.  There is no xanthelasma.  There is no jugular venous distention.  Lungs are clear.  Respiratory effort is nonlabored.  Cardiac exam reveals S1-S2.  There are no clicks or significant murmurs.  The abdomen is soft.  There is no peripheral edema.  He has a reddish tint to his facial skin.  There are no musculoskeletal deformities.  Filed Vitals:   08/25/11 1337  BP: 130/72  Pulse: 62  Height: 6' (1.829 m)  Weight: 238 lb (107.956 kg)    EKG    EKG is done today and reviewed by me.  He has sinus rhythm.  There is decreased R wave in lead V2.  There is no significant change.  ASSESSMENT & PLAN

## 2011-08-25 NOTE — Assessment & Plan Note (Signed)
Coronary disease is stable.  He had a stress echo in 2009 with no definite ischemia.  No further workup.

## 2011-08-25 NOTE — Assessment & Plan Note (Signed)
He continues on Coumadin for his paroxysmal atrial fibrillation.

## 2011-08-25 NOTE — Patient Instructions (Signed)
Your physician recommends that you schedule a follow-up appointment in: 1 year. You will receive a reminder in the mail about 1-2 months in advance reminding you to call and schedule your appointment. If you don't receive this letter, contact our office.  Your physician recommends that you continue on your current medications as directed. Please refer to the Current Medication list given to you today.

## 2011-08-25 NOTE — Assessment & Plan Note (Signed)
He does not have any significant edema today.  No further workup.

## 2011-08-25 NOTE — Assessment & Plan Note (Signed)
Historically there was question of carotid disease that was more significant.  However his Doppler of November, 2011 reveals only mild disease.  No further workup

## 2011-09-19 ENCOUNTER — Other Ambulatory Visit: Payer: Self-pay | Admitting: *Deleted

## 2011-09-19 MED ORDER — METOPROLOL TARTRATE 100 MG PO TABS: 100.0000 mg | ORAL_TABLET | Freq: Two times a day (BID) | ORAL | Status: DC

## 2011-10-02 DIAGNOSIS — E782 Mixed hyperlipidemia: Secondary | ICD-10-CM | POA: Diagnosis not present

## 2011-10-02 DIAGNOSIS — Z79899 Other long term (current) drug therapy: Secondary | ICD-10-CM | POA: Diagnosis not present

## 2011-10-02 DIAGNOSIS — I10 Essential (primary) hypertension: Secondary | ICD-10-CM | POA: Diagnosis not present

## 2011-10-02 DIAGNOSIS — E119 Type 2 diabetes mellitus without complications: Secondary | ICD-10-CM | POA: Diagnosis not present

## 2011-10-02 DIAGNOSIS — I4891 Unspecified atrial fibrillation: Secondary | ICD-10-CM | POA: Diagnosis not present

## 2011-10-22 DIAGNOSIS — I4891 Unspecified atrial fibrillation: Secondary | ICD-10-CM | POA: Diagnosis not present

## 2011-10-23 ENCOUNTER — Other Ambulatory Visit: Payer: Self-pay | Admitting: Cardiology

## 2011-11-24 DIAGNOSIS — I4891 Unspecified atrial fibrillation: Secondary | ICD-10-CM | POA: Diagnosis not present

## 2011-12-17 DIAGNOSIS — I4891 Unspecified atrial fibrillation: Secondary | ICD-10-CM | POA: Diagnosis not present

## 2011-12-22 ENCOUNTER — Other Ambulatory Visit: Payer: Self-pay | Admitting: *Deleted

## 2011-12-22 MED ORDER — LISINOPRIL 10 MG PO TABS: 10.0000 mg | ORAL_TABLET | Freq: Every day | ORAL | Status: DC

## 2012-01-20 DIAGNOSIS — I4891 Unspecified atrial fibrillation: Secondary | ICD-10-CM | POA: Diagnosis not present

## 2012-01-21 DIAGNOSIS — I4891 Unspecified atrial fibrillation: Secondary | ICD-10-CM | POA: Diagnosis not present

## 2012-02-13 DIAGNOSIS — I4891 Unspecified atrial fibrillation: Secondary | ICD-10-CM | POA: Diagnosis not present

## 2012-03-18 DIAGNOSIS — I4891 Unspecified atrial fibrillation: Secondary | ICD-10-CM | POA: Diagnosis not present

## 2012-03-24 ENCOUNTER — Other Ambulatory Visit: Payer: Self-pay | Admitting: Cardiology

## 2012-03-24 ENCOUNTER — Other Ambulatory Visit: Payer: Self-pay | Admitting: *Deleted

## 2012-03-24 MED ORDER — POTASSIUM CHLORIDE CRYS ER 20 MEQ PO TBCR: 20.0000 meq | EXTENDED_RELEASE_TABLET | Freq: Every day | ORAL | Status: DC

## 2012-04-14 DIAGNOSIS — Z7901 Long term (current) use of anticoagulants: Secondary | ICD-10-CM | POA: Diagnosis not present

## 2012-04-23 DIAGNOSIS — I872 Venous insufficiency (chronic) (peripheral): Secondary | ICD-10-CM | POA: Diagnosis not present

## 2012-04-23 DIAGNOSIS — E782 Mixed hyperlipidemia: Secondary | ICD-10-CM | POA: Diagnosis not present

## 2012-04-30 DIAGNOSIS — H269 Unspecified cataract: Secondary | ICD-10-CM | POA: Diagnosis not present

## 2012-04-30 DIAGNOSIS — Z79899 Other long term (current) drug therapy: Secondary | ICD-10-CM | POA: Diagnosis not present

## 2012-04-30 DIAGNOSIS — E119 Type 2 diabetes mellitus without complications: Secondary | ICD-10-CM | POA: Diagnosis not present

## 2012-04-30 DIAGNOSIS — I252 Old myocardial infarction: Secondary | ICD-10-CM | POA: Diagnosis not present

## 2012-04-30 DIAGNOSIS — I10 Essential (primary) hypertension: Secondary | ICD-10-CM | POA: Diagnosis not present

## 2012-04-30 DIAGNOSIS — L97809 Non-pressure chronic ulcer of other part of unspecified lower leg with unspecified severity: Secondary | ICD-10-CM | POA: Diagnosis not present

## 2012-04-30 DIAGNOSIS — Z7901 Long term (current) use of anticoagulants: Secondary | ICD-10-CM | POA: Diagnosis not present

## 2012-04-30 DIAGNOSIS — I872 Venous insufficiency (chronic) (peripheral): Secondary | ICD-10-CM | POA: Diagnosis not present

## 2012-04-30 DIAGNOSIS — Z7982 Long term (current) use of aspirin: Secondary | ICD-10-CM | POA: Diagnosis not present

## 2012-05-05 DIAGNOSIS — I10 Essential (primary) hypertension: Secondary | ICD-10-CM | POA: Diagnosis not present

## 2012-05-05 DIAGNOSIS — E119 Type 2 diabetes mellitus without complications: Secondary | ICD-10-CM | POA: Diagnosis not present

## 2012-05-05 DIAGNOSIS — I252 Old myocardial infarction: Secondary | ICD-10-CM | POA: Diagnosis not present

## 2012-05-05 DIAGNOSIS — I872 Venous insufficiency (chronic) (peripheral): Secondary | ICD-10-CM | POA: Diagnosis not present

## 2012-05-05 DIAGNOSIS — H269 Unspecified cataract: Secondary | ICD-10-CM | POA: Diagnosis not present

## 2012-05-05 DIAGNOSIS — M7989 Other specified soft tissue disorders: Secondary | ICD-10-CM | POA: Diagnosis not present

## 2012-05-05 DIAGNOSIS — L97809 Non-pressure chronic ulcer of other part of unspecified lower leg with unspecified severity: Secondary | ICD-10-CM | POA: Diagnosis not present

## 2012-05-07 DIAGNOSIS — E119 Type 2 diabetes mellitus without complications: Secondary | ICD-10-CM | POA: Diagnosis not present

## 2012-05-07 DIAGNOSIS — I10 Essential (primary) hypertension: Secondary | ICD-10-CM | POA: Diagnosis not present

## 2012-05-07 DIAGNOSIS — I872 Venous insufficiency (chronic) (peripheral): Secondary | ICD-10-CM | POA: Diagnosis not present

## 2012-05-07 DIAGNOSIS — H269 Unspecified cataract: Secondary | ICD-10-CM | POA: Diagnosis not present

## 2012-05-07 DIAGNOSIS — I252 Old myocardial infarction: Secondary | ICD-10-CM | POA: Diagnosis not present

## 2012-05-07 DIAGNOSIS — L97809 Non-pressure chronic ulcer of other part of unspecified lower leg with unspecified severity: Secondary | ICD-10-CM | POA: Diagnosis not present

## 2012-05-07 DIAGNOSIS — I739 Peripheral vascular disease, unspecified: Secondary | ICD-10-CM | POA: Diagnosis not present

## 2012-05-14 DIAGNOSIS — I10 Essential (primary) hypertension: Secondary | ICD-10-CM | POA: Diagnosis not present

## 2012-05-14 DIAGNOSIS — H269 Unspecified cataract: Secondary | ICD-10-CM | POA: Diagnosis not present

## 2012-05-14 DIAGNOSIS — I252 Old myocardial infarction: Secondary | ICD-10-CM | POA: Diagnosis not present

## 2012-05-14 DIAGNOSIS — E119 Type 2 diabetes mellitus without complications: Secondary | ICD-10-CM | POA: Diagnosis not present

## 2012-05-14 DIAGNOSIS — I872 Venous insufficiency (chronic) (peripheral): Secondary | ICD-10-CM | POA: Diagnosis not present

## 2012-05-14 DIAGNOSIS — L97809 Non-pressure chronic ulcer of other part of unspecified lower leg with unspecified severity: Secondary | ICD-10-CM | POA: Diagnosis not present

## 2012-05-17 ENCOUNTER — Encounter: Payer: Self-pay | Admitting: Vascular Surgery

## 2012-05-17 DIAGNOSIS — E119 Type 2 diabetes mellitus without complications: Secondary | ICD-10-CM | POA: Diagnosis not present

## 2012-05-17 DIAGNOSIS — E1149 Type 2 diabetes mellitus with other diabetic neurological complication: Secondary | ICD-10-CM | POA: Diagnosis not present

## 2012-05-18 ENCOUNTER — Encounter: Payer: Self-pay | Admitting: Vascular Surgery

## 2012-05-18 ENCOUNTER — Ambulatory Visit (INDEPENDENT_AMBULATORY_CARE_PROVIDER_SITE_OTHER): Payer: Medicare Other | Admitting: Vascular Surgery

## 2012-05-18 VITALS — BP 157/73 | HR 71 | Resp 20 | Ht 72.0 in | Wt 247.0 lb

## 2012-05-18 DIAGNOSIS — I739 Peripheral vascular disease, unspecified: Secondary | ICD-10-CM

## 2012-05-18 DIAGNOSIS — I872 Venous insufficiency (chronic) (peripheral): Secondary | ICD-10-CM | POA: Diagnosis not present

## 2012-05-18 NOTE — Progress Notes (Signed)
[facility] [place]   Patient name: Reginald [name] MRN: [id number] DOB: 1930-02-17 Sex: male   Referred by: Olena Leatherwood  Reason for referral:  Chief Complaint  Patient presents with  . Venous Insufficiency  . PVD    WOUND ON LEFT LEG, PAIN AND SWELLING    HISTORY OF PRESENT ILLNESS: Patient has today for evaluation of ulcer over his left pretibial area. Very pleasant gentleman with progressive changes of venous hypertension. He does have chronic fluid overload and is on chronic lysis Lasix treatment as well. He does have severe coronary disease. He reports some oozing from his right leg but did develop a venous ulcer on his left leg which is being treated appropriately the wound center with compression. Did have a invasive study from [facility] from 05/07/2012. This shows by the diminished arterial flow at the level of the tibial vessels and no evidence of DVT. Formal reflux study but did show antegrade flow in the veins. He does report a long history of discomfort in his lower Shoney's from swelling does elevate his legs much possible and has been wearing her compression since his treatment with the wound center.  Past Medical History  Diagnosis Date  . GERD (gastroesophageal reflux disease)   . CAD (coronary artery disease)     catheterization 2002,total LAD...single-vessel disease .Marland KitchenMarland KitchenEF 55-60% echo May,2008(EF had been 35% previously)...stress echo 2009...no definite ischemia..question of increased LVOT velocity of 31m/s not recorded RICA,60-79% LICA  . COPD (chronic obstructive pulmonary disease)   . Gouty arthritis   . Spinal stenosis   . Tobacco abuse   . Dyslipidemia   . Renal insufficiency   . Diabetes mellitus   . Gynecomastia     chronic  . Edema of lower extremity     chronic  . Asthma   . Carotid artery disease     Doppler, November, 2011, mild plaque, less than 50% bilateral  . Ejection fraction     Previously 35%, /  EF 55-60%,Echo, 2008   /  stress echo 2009 question increased LVOT velocity 3 m/s but not well recorded  . Atrial fibrillation     2010, Coumadin therapy  . Warfarin anticoagulation     Atrial fibrillation    Past Surgical History  Procedure Date  . Cholecystectomy   . Eye surgery   . Hernia repair     History   Social History  . Marital Status: Widowed    Spouse Name: N/A    Number of Children: N/A  . Years of Education: N/A   Occupational History  . Not on file.   Social History Main Topics  . Smoking status: Former Smoker -- 0.8 packs/day for 40 years    Types: Cigars    Quit date: 09/29/2008  . Smokeless tobacco: Never Used  . Alcohol Use: No  . Drug Use: No  . Sexually Active: Not on file   Other Topics Concern  . Not on file   Social History Narrative  . No narrative on file    Family History  Problem Relation Age of Onset  . Diabetes Mother     Allergies as of 05/18/2012 - Review Complete 05/18/2012  Allergen Reaction Noted  . Morphine      Current Outpatient Prescriptions on File Prior to Visit  Medication Sig Dispense Refill  . allopurinol (ZYLOPRIM) 100 MG tablet Take 100 mg by mouth daily.        Marland Kitchen aspirin EC 81 MG tablet Take 81  mg by mouth daily.        . calcium-vitamin D (OSCAL WITH D) 500-200 MG-UNIT per tablet Take 1 tablet by mouth 2 (two) times daily.        . Colesevelam HCl (WELCHOL) 3.75 G PACK Take 1 each by mouth daily.        . colestipol (COLESTID) 1 G tablet Take 1 g by mouth 2 (two) times daily.        . fenofibrate (TRICOR) 145 MG tablet Take 145 mg by mouth daily.        . fish oil-omega-3 fatty acids 1000 MG capsule Take 1 g by mouth 3 (three) times daily.        . furosemide (LASIX) 40 MG tablet Take 40 mg by mouth 2 (two) times daily.        Marland Kitchen glyBURIDE (DIABETA) 5 MG tablet Take 5 mg by mouth 2 (two) times daily with a meal.        . isosorbide mononitrate (IMDUR) 60 MG 24 hr tablet Take 60 mg by mouth daily.        Marland Kitchen lisinopril (PRINIVIL) 10  MG tablet Take 1 tablet (10 mg total) by mouth daily.  30 tablet  6  . metoprolol (LOPRESSOR) 100 MG tablet Take 1 tablet (100 mg total) by mouth 2 (two) times daily.  60 tablet  6  . Multiple Vitamin (MULTIVITAMIN) tablet Take 1 tablet by mouth daily.        . potassium chloride SA (K-DUR,KLOR-CON) 20 MEQ tablet Take 1 tablet (20 mEq total) by mouth daily.  30 tablet  8  . ranitidine (ZANTAC) 150 MG tablet Take 150 mg by mouth 2 (two) times daily.        Marland Kitchen warfarin (COUMADIN) 5 MG tablet Take 5 mg by mouth as directed. Per Dr. Olena Leatherwood office          REVIEW OF SYSTEMS:  Positives indicated with an "X"  CARDIOVASCULAR:  [ ]  chest pain   [ ]  chest pressure   [ ]  palpitations   [ ]  orthopnea   [ ]  dyspnea on exertion   [x ] claudication   [ ]  rest pain   [ ]  DVT   [x ] phlebitis PULMONARY:   [ ]  productive cough   [ ]  asthma   [ ]  wheezing NEUROLOGIC:   [ ]  weakness  [ ]  paresthesias  [ ]  aphasia  [ ]  amaurosis  [ ]  dizziness HEMATOLOGIC:   [ ]  bleeding problems   [ ]  clotting disorders MUSCULOSKELETAL:  [ ]  joint pain   [ ]  joint swelling GASTROINTESTINAL: [ ]   blood in stool  [ ]   hematemesis GENITOURINARY:  [ ]   dysuria  [ ]   hematuria PSYCHIATRIC:  [ ]  history of major depression INTEGUMENTARY:  [ ]  rashes  [ ]  ulcers CONSTITUTIONAL:  [ ]  fever   [ ]  chills  PHYSICAL EXAMINATION:  General: The patient is a well-nourished male, in no acute distress. Vital signs are BP 157/73  Pulse 71  Resp 20  Ht 6' (1.829 m)  Wt 247 lb (112.038 kg)  BMI 33.50 kg/m2 Pulmonary: There is a good air exchange bilaterally without wheezing or rales. Abdomen: Soft and non-tender with normal pitch bowel sounds. Moderate obesity Musculoskeletal: There are no major deformities.  There is no significant extremity pain. Neurologic: No focal weakness or paresthesias are detected, Skin: Marked changes of venous hypertension bilaterally with a circumferential hemosiderin deposits from below his  knee to his  ankles bilaterally. He does have a 2 cm clean pretibial venous ulcer on his left leg. Psychiatric: The patient has normal affect. Cardiovascular: There is a regular rate and rhythm without significant murmur appreciated. Pulse status: 2+ femoral pulses bilaterally 2+ popliteal pulses bilaterally I do not palpate pedal pulses on the right he does have a 1-2+ dorsalis pedis pulse on the left  I imaged his left great saphenous vein with SonoSite and he does have enlargement and reflux in his left great saphenous vein  Impression and Plan:  Severe bilateral venous hypertension with open venous ulcer on the left. I would recommend continued treatment is being done at the wound center since this is improving his swelling and healing. He was fitted today with thigh-high graduated compression garments and explained the critical importance of wearing these on his right leg as well. We will see him again in 3 months for continued discussion and obtained a formal reflexivity at that time. I explained that he may be a candidate for improvement in his venous hypertension with a saphenous vein ablation if he does not have significant reflux in his deep system. We will determine this at the time of his next visit in 3 months    Issai Werling [facility] [place] Office: [phone]

## 2012-05-21 DIAGNOSIS — I10 Essential (primary) hypertension: Secondary | ICD-10-CM | POA: Diagnosis not present

## 2012-05-21 DIAGNOSIS — L97809 Non-pressure chronic ulcer of other part of unspecified lower leg with unspecified severity: Secondary | ICD-10-CM | POA: Diagnosis not present

## 2012-05-21 DIAGNOSIS — E119 Type 2 diabetes mellitus without complications: Secondary | ICD-10-CM | POA: Diagnosis not present

## 2012-05-21 DIAGNOSIS — I252 Old myocardial infarction: Secondary | ICD-10-CM | POA: Diagnosis not present

## 2012-05-21 DIAGNOSIS — I872 Venous insufficiency (chronic) (peripheral): Secondary | ICD-10-CM | POA: Diagnosis not present

## 2012-05-21 DIAGNOSIS — H269 Unspecified cataract: Secondary | ICD-10-CM | POA: Diagnosis not present

## 2012-05-24 ENCOUNTER — Other Ambulatory Visit: Payer: Self-pay | Admitting: Cardiology

## 2012-05-27 DIAGNOSIS — I4891 Unspecified atrial fibrillation: Secondary | ICD-10-CM | POA: Diagnosis not present

## 2012-05-28 DIAGNOSIS — I10 Essential (primary) hypertension: Secondary | ICD-10-CM | POA: Diagnosis not present

## 2012-05-28 DIAGNOSIS — E119 Type 2 diabetes mellitus without complications: Secondary | ICD-10-CM | POA: Diagnosis not present

## 2012-05-28 DIAGNOSIS — L97809 Non-pressure chronic ulcer of other part of unspecified lower leg with unspecified severity: Secondary | ICD-10-CM | POA: Diagnosis not present

## 2012-05-28 DIAGNOSIS — I252 Old myocardial infarction: Secondary | ICD-10-CM | POA: Diagnosis not present

## 2012-05-28 DIAGNOSIS — I872 Venous insufficiency (chronic) (peripheral): Secondary | ICD-10-CM | POA: Diagnosis not present

## 2012-05-28 DIAGNOSIS — H269 Unspecified cataract: Secondary | ICD-10-CM | POA: Diagnosis not present

## 2012-06-04 DIAGNOSIS — I872 Venous insufficiency (chronic) (peripheral): Secondary | ICD-10-CM | POA: Diagnosis not present

## 2012-06-04 DIAGNOSIS — I8390 Asymptomatic varicose veins of unspecified lower extremity: Secondary | ICD-10-CM | POA: Diagnosis not present

## 2012-06-04 DIAGNOSIS — L97809 Non-pressure chronic ulcer of other part of unspecified lower leg with unspecified severity: Secondary | ICD-10-CM | POA: Diagnosis not present

## 2012-06-11 DIAGNOSIS — L97909 Non-pressure chronic ulcer of unspecified part of unspecified lower leg with unspecified severity: Secondary | ICD-10-CM | POA: Diagnosis not present

## 2012-06-11 DIAGNOSIS — I83009 Varicose veins of unspecified lower extremity with ulcer of unspecified site: Secondary | ICD-10-CM | POA: Diagnosis not present

## 2012-06-18 DIAGNOSIS — I872 Venous insufficiency (chronic) (peripheral): Secondary | ICD-10-CM | POA: Diagnosis not present

## 2012-06-18 DIAGNOSIS — L97809 Non-pressure chronic ulcer of other part of unspecified lower leg with unspecified severity: Secondary | ICD-10-CM | POA: Diagnosis not present

## 2012-06-23 DIAGNOSIS — I4891 Unspecified atrial fibrillation: Secondary | ICD-10-CM | POA: Diagnosis not present

## 2012-06-25 DIAGNOSIS — L97809 Non-pressure chronic ulcer of other part of unspecified lower leg with unspecified severity: Secondary | ICD-10-CM | POA: Diagnosis not present

## 2012-06-25 DIAGNOSIS — I872 Venous insufficiency (chronic) (peripheral): Secondary | ICD-10-CM | POA: Diagnosis not present

## 2012-07-02 DIAGNOSIS — L97809 Non-pressure chronic ulcer of other part of unspecified lower leg with unspecified severity: Secondary | ICD-10-CM | POA: Diagnosis not present

## 2012-07-02 DIAGNOSIS — I872 Venous insufficiency (chronic) (peripheral): Secondary | ICD-10-CM | POA: Diagnosis not present

## 2012-07-07 DIAGNOSIS — I4891 Unspecified atrial fibrillation: Secondary | ICD-10-CM | POA: Diagnosis not present

## 2012-07-09 DIAGNOSIS — I872 Venous insufficiency (chronic) (peripheral): Secondary | ICD-10-CM | POA: Diagnosis not present

## 2012-07-09 DIAGNOSIS — L97809 Non-pressure chronic ulcer of other part of unspecified lower leg with unspecified severity: Secondary | ICD-10-CM | POA: Diagnosis not present

## 2012-07-13 ENCOUNTER — Other Ambulatory Visit: Payer: Self-pay | Admitting: *Deleted

## 2012-07-13 DIAGNOSIS — I83893 Varicose veins of bilateral lower extremities with other complications: Secondary | ICD-10-CM

## 2012-07-21 DIAGNOSIS — I4891 Unspecified atrial fibrillation: Secondary | ICD-10-CM | POA: Diagnosis not present

## 2012-07-30 DIAGNOSIS — J209 Acute bronchitis, unspecified: Secondary | ICD-10-CM | POA: Diagnosis not present

## 2012-07-30 DIAGNOSIS — Z79899 Other long term (current) drug therapy: Secondary | ICD-10-CM | POA: Diagnosis not present

## 2012-07-30 DIAGNOSIS — I10 Essential (primary) hypertension: Secondary | ICD-10-CM | POA: Diagnosis not present

## 2012-07-30 DIAGNOSIS — E782 Mixed hyperlipidemia: Secondary | ICD-10-CM | POA: Diagnosis not present

## 2012-08-16 ENCOUNTER — Encounter: Payer: Self-pay | Admitting: Vascular Surgery

## 2012-08-16 DIAGNOSIS — E1149 Type 2 diabetes mellitus with other diabetic neurological complication: Secondary | ICD-10-CM | POA: Diagnosis not present

## 2012-08-16 DIAGNOSIS — E119 Type 2 diabetes mellitus without complications: Secondary | ICD-10-CM | POA: Diagnosis not present

## 2012-08-17 ENCOUNTER — Ambulatory Visit: Payer: Medicare Other | Admitting: Vascular Surgery

## 2012-08-17 ENCOUNTER — Encounter: Payer: Self-pay | Admitting: Vascular Surgery

## 2012-08-17 ENCOUNTER — Ambulatory Visit (INDEPENDENT_AMBULATORY_CARE_PROVIDER_SITE_OTHER): Payer: Medicare Other | Admitting: Vascular Surgery

## 2012-08-17 ENCOUNTER — Encounter (INDEPENDENT_AMBULATORY_CARE_PROVIDER_SITE_OTHER): Payer: Medicare Other | Admitting: *Deleted

## 2012-08-17 VITALS — BP 135/64 | HR 58 | Resp 16 | Ht 72.0 in | Wt 242.9 lb

## 2012-08-17 DIAGNOSIS — M7989 Other specified soft tissue disorders: Secondary | ICD-10-CM

## 2012-08-17 DIAGNOSIS — I83893 Varicose veins of bilateral lower extremities with other complications: Secondary | ICD-10-CM | POA: Diagnosis not present

## 2012-08-17 DIAGNOSIS — L98499 Non-pressure chronic ulcer of skin of other sites with unspecified severity: Secondary | ICD-10-CM

## 2012-08-17 DIAGNOSIS — I872 Venous insufficiency (chronic) (peripheral): Secondary | ICD-10-CM

## 2012-08-17 NOTE — Progress Notes (Signed)
Problems with Activities of Daily Living Secondary to Leg Pain  1. Mr. Sapp works 8 hour shifts and has to stand for prolonged periods. This is very difficult due to leg pain.  2. Mr. Springer states he great difficulty with any activities that require squatting due to leg pain.    3.  Mr. Frerking is unable to exercise due to leg pain and swelling.  Failure of  Conservative Therapy:  1. Worn 20-30 mm Hg thigh high compression hose >3 months with no relief of symptoms.  2. Frequently elevates legs-no relief of symptoms  3. Taken Ibuprofen 600 Mg TID with no relief of symptoms.  Patient has today for followup of his bilateral lower extremity venous hypertension. He had been treated at the wound center for approximately 10 weeks and does have near-total healing of the ulcer over his left pretibial area. There is no significant change in his physical exam. He does have extensive changes of venous hypertension bilaterally. He has marked hemosiderin deposits from his proximal calf distally down to his ankles.  He did undergo formal venous duplex in our office today and this does show reflux in his superficial and deep system bilaterally. He does have significant reflux in both great saphenous veins.  A long discussion with the patient. I did explain that the deep venous reflux was not correctable but the superficial reflux was with the laser ablation. I explained this would make a positive impact on his venous hypertension and swelling. I did explain that it was acceptable to continue with compression dressing and observation. He reports that he is "on a fence" as far as treatment options. We will continue to follow him as an outpatient. He is tentatively scheduled to see [place] in 6 months for further discussion. He will let us know if he develops any ulceration or other difficulties in the interim. He did purchase another pair thigh-high compression garments today 20-30 mm mercury and has been extremely  compliant with these.

## 2012-08-18 DIAGNOSIS — I4891 Unspecified atrial fibrillation: Secondary | ICD-10-CM | POA: Diagnosis not present

## 2012-08-20 ENCOUNTER — Other Ambulatory Visit: Payer: Self-pay | Admitting: *Deleted

## 2012-08-20 ENCOUNTER — Other Ambulatory Visit: Payer: Self-pay | Admitting: Cardiology

## 2012-08-20 DIAGNOSIS — I83893 Varicose veins of bilateral lower extremities with other complications: Secondary | ICD-10-CM

## 2012-08-20 MED ORDER — LISINOPRIL 10 MG PO TABS: 10.0000 mg | ORAL_TABLET | Freq: Every day | ORAL | Status: DC

## 2012-08-24 DIAGNOSIS — Z23 Encounter for immunization: Secondary | ICD-10-CM | POA: Diagnosis not present

## 2012-10-15 DIAGNOSIS — H01009 Unspecified blepharitis unspecified eye, unspecified eyelid: Secondary | ICD-10-CM | POA: Diagnosis not present

## 2012-10-20 DIAGNOSIS — I4891 Unspecified atrial fibrillation: Secondary | ICD-10-CM | POA: Diagnosis not present

## 2012-10-21 ENCOUNTER — Other Ambulatory Visit: Payer: Self-pay | Admitting: *Deleted

## 2012-10-21 MED ORDER — LISINOPRIL 10 MG PO TABS: 10.0000 mg | ORAL_TABLET | Freq: Every day | ORAL | Status: DC

## 2012-10-21 MED ORDER — METOPROLOL TARTRATE 100 MG PO TABS: 100.0000 mg | ORAL_TABLET | Freq: Two times a day (BID) | ORAL | Status: DC

## 2012-10-25 DIAGNOSIS — E1149 Type 2 diabetes mellitus with other diabetic neurological complication: Secondary | ICD-10-CM | POA: Diagnosis not present

## 2012-10-25 DIAGNOSIS — E119 Type 2 diabetes mellitus without complications: Secondary | ICD-10-CM | POA: Diagnosis not present

## 2012-11-02 DIAGNOSIS — I10 Essential (primary) hypertension: Secondary | ICD-10-CM | POA: Diagnosis not present

## 2012-11-10 ENCOUNTER — Ambulatory Visit: Payer: Medicare Other | Admitting: Cardiology

## 2012-11-17 DIAGNOSIS — I4891 Unspecified atrial fibrillation: Secondary | ICD-10-CM | POA: Diagnosis not present

## 2012-11-23 ENCOUNTER — Other Ambulatory Visit: Payer: Self-pay | Admitting: Cardiology

## 2012-11-23 MED ORDER — LISINOPRIL 10 MG PO TABS: 10.0000 mg | ORAL_TABLET | Freq: Every day | ORAL | Status: DC

## 2012-12-21 ENCOUNTER — Other Ambulatory Visit: Payer: Self-pay | Admitting: Cardiology

## 2012-12-21 MED ORDER — METOPROLOL TARTRATE 100 MG PO TABS: 100.0000 mg | ORAL_TABLET | Freq: Two times a day (BID) | ORAL | Status: DC

## 2012-12-29 DIAGNOSIS — I4891 Unspecified atrial fibrillation: Secondary | ICD-10-CM | POA: Diagnosis not present

## 2013-01-12 DIAGNOSIS — I4891 Unspecified atrial fibrillation: Secondary | ICD-10-CM | POA: Diagnosis not present

## 2013-01-21 DIAGNOSIS — E119 Type 2 diabetes mellitus without complications: Secondary | ICD-10-CM | POA: Diagnosis not present

## 2013-01-30 ENCOUNTER — Encounter: Payer: Self-pay | Admitting: Cardiology

## 2013-01-31 ENCOUNTER — Ambulatory Visit (INDEPENDENT_AMBULATORY_CARE_PROVIDER_SITE_OTHER): Payer: Medicare Other | Admitting: Cardiology

## 2013-01-31 ENCOUNTER — Encounter: Payer: Self-pay | Admitting: Cardiology

## 2013-01-31 VITALS — BP 115/66 | HR 62 | Ht 72.0 in | Wt 245.1 lb

## 2013-01-31 DIAGNOSIS — R9430 Abnormal result of cardiovascular function study, unspecified: Secondary | ICD-10-CM

## 2013-01-31 DIAGNOSIS — I83893 Varicose veins of bilateral lower extremities with other complications: Secondary | ICD-10-CM

## 2013-01-31 DIAGNOSIS — I4891 Unspecified atrial fibrillation: Secondary | ICD-10-CM

## 2013-01-31 DIAGNOSIS — Z7901 Long term (current) use of anticoagulants: Secondary | ICD-10-CM

## 2013-01-31 DIAGNOSIS — R0989 Other specified symptoms and signs involving the circulatory and respiratory systems: Secondary | ICD-10-CM

## 2013-01-31 DIAGNOSIS — I779 Disorder of arteries and arterioles, unspecified: Secondary | ICD-10-CM | POA: Diagnosis not present

## 2013-01-31 DIAGNOSIS — I2510 Atherosclerotic heart disease of native coronary artery without angina pectoris: Secondary | ICD-10-CM

## 2013-01-31 NOTE — Assessment & Plan Note (Signed)
He has carotid disease and he needs followup. His last Doppler was November, 2011.

## 2013-01-31 NOTE — Assessment & Plan Note (Signed)
He has an appointment with vascular surgery later this week for more complete evaluation with possible plans for surgery.

## 2013-01-31 NOTE — Assessment & Plan Note (Signed)
He's not having any significant cardiac ischemic symptoms. No further workup.

## 2013-01-31 NOTE — Patient Instructions (Addendum)
Your physician recommends that you schedule a follow-up appointment in: 8 weeks. Your physician recommends that you continue on your current medications as directed. Please refer to the Current Medication list given to you today. Your physician has requested that you have a carotid duplex. This test is an ultrasound of the carotid arteries in your neck. It looks at blood flow through these arteries that supply the brain with blood. Allow one hour for this exam. There are no restrictions or special instructions. Your physician has requested that you have an echocardiogram. Echocardiography is a painless test that uses sound waves to create images of your heart. It provides your doctor with information about the size and shape of your heart and how well your heart's chambers and valves are working. This procedure takes approximately one hour. There are no restrictions for this procedure.

## 2013-01-31 NOTE — Progress Notes (Signed)
HPI  The patient is seen today to followup his coronary disease. I saw him last in the office November, 2012. As part of today's evaluation I have reviewed our records from that time. I have also reviewed other records including visits with vascular surgery that he has had since that time. I have completely updated the current electronic medical record. His last carotid Doppler was done greater than 2 years ago. He will need followup of his carotid artery disease.  Patient has significant venous disease of the legs. He is considering further treatment by vascular surgery. He will be seeing them later this month. Because of his leg problems his overall ability to ambulate is limited. However he is not having any chest pain or shortness of breath at his level of ambulation. It is of interest that it takes him approximately 15 minutes to get on his special support hose. It is a major task with increased heart rate. He does not have chest pain or shortness of breath with that.  Allergies  Allergen Reactions  . Morphine     REACTION: hallucinations    Current Outpatient Prescriptions  Medication Sig Dispense Refill  . allopurinol (ZYLOPRIM) 100 MG tablet Take 100 mg by mouth daily.        Marland Kitchen aspirin EC 81 MG tablet Take 81 mg by mouth daily.        . calcium-vitamin D (OSCAL WITH D) 500-200 MG-UNIT per tablet Take 1 tablet by mouth 2 (two) times daily.        . Colesevelam HCl (WELCHOL) 3.75 G PACK Take 1 each by mouth daily.        . colestipol (COLESTID) 1 G tablet Take 1 g by mouth 2 (two) times daily.        . fenofibrate (TRICOR) 145 MG tablet Take 145 mg by mouth daily.        . fish oil-omega-3 fatty acids 1000 MG capsule Take 1 g by mouth 3 (three) times daily.        . furosemide (LASIX) 40 MG tablet Take 80mg  every morning & 40 mg every evening      . glyBURIDE (DIABETA) 5 MG tablet Take 5 mg by mouth 2 (two) times daily with a meal.        . isosorbide mononitrate (IMDUR) 60 MG 24 hr  tablet Take 60 mg by mouth daily.        Marland Kitchen lisinopril (PRINIVIL) 10 MG tablet Take 1 tablet (10 mg total) by mouth daily.  30 tablet  3  . metoprolol (LOPRESSOR) 100 MG tablet Take 1 tablet (100 mg total) by mouth 2 (two) times daily.  60 tablet  1  . Multiple Vitamin (MULTIVITAMIN) tablet Take 1 tablet by mouth daily.        . Multiple Vitamins-Minerals (VISION FORMULA/LUTEIN) TABS Take 1 tablet by mouth daily.      . potassium chloride SA (K-DUR,KLOR-CON) 20 MEQ tablet Take 1 tablet (20 mEq total) by mouth daily.  30 tablet  8  . ranitidine (ZANTAC) 150 MG tablet Take 150 mg by mouth 2 (two) times daily.        Marland Kitchen warfarin (COUMADIN) 5 MG tablet Take 5 mg by mouth as directed. Per Dr. Olena Leatherwood office        No current facility-administered medications for this visit.    History   Social History  . Marital Status: Widowed    Spouse Name: N/A    Number of Children: N/A  .  Years of Education: N/A   Occupational History  . Not on file.   Social History Main Topics  . Smoking status: Former Smoker -- 0.80 packs/day for 40 years    Types: Cigars    Quit date: 09/29/2008  . Smokeless tobacco: Never Used     Comment: quit cig 30-40 yrs ago / cigars - quit 2010  . Alcohol Use: No  . Drug Use: No  . Sexually Active: Not on file   Other Topics Concern  . Not on file   Social History Narrative  . No narrative on file    Family History  Problem Relation Age of Onset  . Diabetes Mother     Past Medical History  Diagnosis Date  . GERD (gastroesophageal reflux disease)   . CAD (coronary artery disease)     catheterization 2002,total LAD...single-vessel disease .Marland KitchenMarland KitchenEF 55-60% echo May,2008(EF had been 35% previously)...stress echo 2009...no definite ischemia..question of increased LVOT velocity of 65m/s not recorded RICA,60-79% LICA  . COPD (chronic obstructive pulmonary disease)   . Gouty arthritis   . Spinal stenosis   . Tobacco abuse   . Dyslipidemia   . Renal insufficiency   .  Diabetes mellitus   . Gynecomastia     chronic  . Edema of lower extremity     chronic  . Asthma   . Carotid artery disease     Doppler, November, 2011, mild plaque, less than 50% bilateral  . Ejection fraction     Previously 35%, /  EF 55-60%,Echo, 2008  /  stress echo 2009 question increased LVOT velocity 3 m/s but not well recorded  . Atrial fibrillation     2010, Coumadin therapy  . Warfarin anticoagulation     Atrial fibrillation  . Varicose veins   . Leg pain, bilateral   . Leg swelling     Past Surgical History  Procedure Laterality Date  . Cholecystectomy    . Eye surgery    . Hernia repair      Patient Active Problem List   Diagnosis Date Noted  . Varicose veins of lower extremities with other complications 08/17/2012  . Venous insufficiency 05/18/2012  . PVD (peripheral vascular disease) 05/18/2012  . CAD (coronary artery disease)   . Ejection fraction   . Edema of lower extremity   . Dyslipidemia   . Tobacco abuse   . COPD (chronic obstructive pulmonary disease)   . Renal insufficiency   . Asthma   . Carotid artery disease   . Warfarin anticoagulation   . Atrial fibrillation   . DM 08/07/2010  . GERD 08/07/2010  . GYNECOMASTIA 08/07/2010    ROS   Patient denies fever, chills, headache, sweats, rash, change in vision, change in hearing, chest pain, cough, nausea vomiting, urinary symptoms. All other systems are reviewed and are negative.  PHYSICAL EXAM  Patient is oriented to person time and place. Affect is normal. There is no jugulovenous distention. Lungs are clear. Respiratory effort is nonlabored. Cardiac exam Reveals an S1 and S2. There no clicks or significant murmurs. The abdomen is soft. He does have some peripheral edema. There no musculoskeletal deformities. There are no skin rashes.   Filed Vitals:   01/31/13 1047  BP: 115/66  Pulse: 62  Height: 6' (1.829 m)  Weight: 245 lb 1.9 oz (111.186 kg)  SpO2: 95%   EKG is done today and  reviewed by me. He is holding sinus rhythm. There is decreased anterior R wave progression. I have compared  this tracing to a prior tracing in 2012 and there is no significant change.  ASSESSMENT & PLAN

## 2013-01-31 NOTE — Assessment & Plan Note (Signed)
Currently he is holding sinus rhythm. No change in therapy.

## 2013-01-31 NOTE — Assessment & Plan Note (Signed)
Ongoing Coumadin is indicated for his history of paroxysmal atrial fibrillation.  Two-dimensional echo and carotid Doppler will be scheduled. As part of today's evaluation I spent greater than 25 minutes with his overall care. I spent more than half of this amount of time with direct contact talking with him and making plans.

## 2013-01-31 NOTE — Assessment & Plan Note (Signed)
His last echo was in 2010. His EF was 45-50%. It is important to reassess his LV function at this time. He may be getting ready to undergo vascular surgery.

## 2013-02-01 DIAGNOSIS — E782 Mixed hyperlipidemia: Secondary | ICD-10-CM | POA: Diagnosis not present

## 2013-02-01 DIAGNOSIS — I10 Essential (primary) hypertension: Secondary | ICD-10-CM | POA: Diagnosis not present

## 2013-02-01 DIAGNOSIS — I4891 Unspecified atrial fibrillation: Secondary | ICD-10-CM | POA: Diagnosis not present

## 2013-02-10 ENCOUNTER — Encounter (INDEPENDENT_AMBULATORY_CARE_PROVIDER_SITE_OTHER): Payer: Medicare Other

## 2013-02-10 DIAGNOSIS — I6529 Occlusion and stenosis of unspecified carotid artery: Secondary | ICD-10-CM

## 2013-02-15 ENCOUNTER — Ambulatory Visit: Payer: Medicare Other | Admitting: Vascular Surgery

## 2013-02-16 ENCOUNTER — Encounter: Payer: Self-pay | Admitting: Cardiology

## 2013-02-17 ENCOUNTER — Other Ambulatory Visit: Payer: Medicare Other

## 2013-02-18 ENCOUNTER — Encounter: Payer: Self-pay | Admitting: Vascular Surgery

## 2013-02-22 ENCOUNTER — Other Ambulatory Visit: Payer: Self-pay | Admitting: *Deleted

## 2013-02-22 ENCOUNTER — Ambulatory Visit (INDEPENDENT_AMBULATORY_CARE_PROVIDER_SITE_OTHER): Payer: Medicare Other | Admitting: Vascular Surgery

## 2013-02-22 ENCOUNTER — Encounter: Payer: Self-pay | Admitting: Vascular Surgery

## 2013-02-22 VITALS — BP 111/59 | HR 70 | Resp 16 | Ht 72.0 in | Wt 249.0 lb

## 2013-02-22 DIAGNOSIS — I83893 Varicose veins of bilateral lower extremities with other complications: Secondary | ICD-10-CM

## 2013-02-22 DIAGNOSIS — M79609 Pain in unspecified limb: Secondary | ICD-10-CM | POA: Diagnosis not present

## 2013-02-22 MED ORDER — METOPROLOL TARTRATE 100 MG PO TABS: 100.0000 mg | ORAL_TABLET | Freq: Two times a day (BID) | ORAL | Status: DC

## 2013-02-22 NOTE — Progress Notes (Signed)
Problems with Activities of Daily Living Secondary to Leg Pain  1. Reginald [name] states he is unable to do yard work due to severe leg pain.  2. Reginald [name] states he is unable to exercise due to severe leg pain.  3. Reginald [name] states he has difficulty driving the car and taking car trips due to severe leg pain.   Failure of  Conservative Therapy:  1. Worn 20-30 mm Hg thigh high compression hose >3 months with no relief of symptoms.  2. Frequently elevates legs-no relief of symptoms  3. Taken Ibuprofen 600 Mg TID with no relief of symptoms.  Patient presents today for continued discussion regarding his lower extremity venous hypertension. He has been extremely compliant with his graduated compression garment but is having increasing severe bilateral lower extremity discomfort. He reports this as an achy sensation at the end of the day. He does have swelling as well.  Physical exam he does not have any open ulcerations currently. He does have marked changes of venous hypertension with circumferential hemosiderin deposits on both lower extremities.  I reimaged his veins with sinusitis does show reflux in his great saphenous vein bilaterally  A long discussion with the patient again regarding this. I feel that he does have severe venous hypertension bilaterally with both superficial and deep venous reflux. I have recommended laser ablation for improvement of his venous hypertension. He reports the left side his most severe therefore we will treat this first and then treat his right leg as a staged procedure. He understands this is an outpatient procedure under local anesthesia with a slight chance of DVT associated with the procedure. We will proceed at his earliest convenience

## 2013-02-23 ENCOUNTER — Other Ambulatory Visit (INDEPENDENT_AMBULATORY_CARE_PROVIDER_SITE_OTHER): Payer: Medicare Other

## 2013-02-23 ENCOUNTER — Other Ambulatory Visit: Payer: Self-pay

## 2013-02-23 DIAGNOSIS — I4891 Unspecified atrial fibrillation: Secondary | ICD-10-CM | POA: Diagnosis not present

## 2013-02-23 DIAGNOSIS — I2510 Atherosclerotic heart disease of native coronary artery without angina pectoris: Secondary | ICD-10-CM

## 2013-02-24 ENCOUNTER — Encounter: Payer: Self-pay | Admitting: *Deleted

## 2013-02-25 ENCOUNTER — Encounter: Payer: Self-pay | Admitting: Cardiology

## 2013-02-28 ENCOUNTER — Encounter: Payer: Self-pay | Admitting: *Deleted

## 2013-03-02 ENCOUNTER — Encounter: Payer: Self-pay | Admitting: Vascular Surgery

## 2013-03-03 ENCOUNTER — Encounter: Payer: Self-pay | Admitting: Vascular Surgery

## 2013-03-03 ENCOUNTER — Other Ambulatory Visit: Payer: Self-pay | Admitting: *Deleted

## 2013-03-03 ENCOUNTER — Ambulatory Visit (INDEPENDENT_AMBULATORY_CARE_PROVIDER_SITE_OTHER): Payer: Medicare Other | Admitting: Vascular Surgery

## 2013-03-03 VITALS — BP 128/63 | HR 60 | Resp 18 | Ht 72.0 in | Wt 249.1 lb

## 2013-03-03 DIAGNOSIS — I83893 Varicose veins of bilateral lower extremities with other complications: Secondary | ICD-10-CM

## 2013-03-03 DIAGNOSIS — I872 Venous insufficiency (chronic) (peripheral): Secondary | ICD-10-CM

## 2013-03-03 DIAGNOSIS — R609 Edema, unspecified: Secondary | ICD-10-CM

## 2013-03-03 DIAGNOSIS — M79609 Pain in unspecified limb: Secondary | ICD-10-CM

## 2013-03-03 HISTORY — PX: ENDOVENOUS ABLATION SAPHENOUS VEIN W/ LASER: SUR449

## 2013-03-03 NOTE — Progress Notes (Signed)
Laser Ablation Procedure      Date: 03/03/2013    Reginald [name] DOB:1930-09-18  Consent signed: Yes  Surgeon:T.F. Early  Procedure: Laser Ablation: left Greater Saphenous Vein  BP 128/63  Pulse 60  Resp 18  Ht 6' (1.829 m)  Wt 249 lb 1.6 oz (112.991 kg)  BMI 33.78 kg/m2  Start time: 11:00AM   End time: 12:00PM  Tumescent Anesthesia: 475 cc 0.9% NaCl with 50 cc Lidocaine HCL with 1% Epi and 15 cc 8.4% NaHCO3  Local Anesthesia: 4 cc Lidocaine HCL and NaHCO3 (ratio 2:1)  Continuous Mode: 15 Watts Total Energy 2724 Joules Total Time3:01       Patient tolerated procedure well: Yes  Notes: [name] stopped Coumadin on Saturday, Feb 26, 2013.  Description of Procedure:  After marking the course of the saphenous vein and the secondary varicosities in the standing position, the patient was placed on the operating table in the supine position, and the left leg was prepped and draped in sterile fashion. Local anesthetic was administered, and under ultrasound guidance the saphenous vein was accessed with a micro needle and guide wire; then the micro puncture sheath was placed. A guide wire was inserted to the saphenofemoral junction, followed by a 5 french sheath.  The position of the sheath and then the laser fiber below the junction was confirmed using the ultrasound and visualization of the aiming beam.  Tumescent anesthesia was administered along the course of the saphenous vein using ultrasound guidance. Protective laser glasses were placed on the patient, and the laser was fired at at 15 watt continuous mode.  For a total of 2724 joules.  A steri strip was applied to the puncture site.   ABD pads and thigh high compression stockings were applied.  Ace wrap bandages were applied  at the top of the saphenofemoral junction.  Blood loss was less than 15 cc.  The patient ambulated out of the operating room having tolerated the procedure well.

## 2013-03-04 ENCOUNTER — Telehealth: Payer: Self-pay | Admitting: *Deleted

## 2013-03-04 NOTE — Telephone Encounter (Signed)
03/04/2013  Time: 12:17 PM   Patient Name: Reginald [name]  Patient of: T.F. Early  Procedure:Laser Ablation left greater saphenous vein 03-03-2013  Reached patient at home and checked  His status  Yes    Comments/Actions Taken: Mr. Mccreadie is not having any problems with bleeding or swelling.  He states he is having moderate pain along his left inner thigh and along the side of the left inner knee (where the IV was started).  Encouraged him to keep his left leg elevated, use ice compress to left thigh and knee prn pain, and take Tylenol for pain.  Mr. Orona is unable to take Ibuprofen because he is on Coumadin therapy. Reviewed post procedural instructions with Mr. Wombles and reminded him of post laser ablation duplex and follow up appointment with Dr. Hart Rochester (Dr. Arbie Cookey is out of town) on 03-10-2013.         @SIGNATURE @

## 2013-03-08 ENCOUNTER — Ambulatory Visit: Payer: Medicare Other | Admitting: Vascular Surgery

## 2013-03-09 ENCOUNTER — Encounter: Payer: Self-pay | Admitting: Vascular Surgery

## 2013-03-09 DIAGNOSIS — I4891 Unspecified atrial fibrillation: Secondary | ICD-10-CM | POA: Diagnosis not present

## 2013-03-10 ENCOUNTER — Encounter: Payer: Self-pay | Admitting: Vascular Surgery

## 2013-03-10 ENCOUNTER — Ambulatory Visit (INDEPENDENT_AMBULATORY_CARE_PROVIDER_SITE_OTHER): Payer: Medicare Other | Admitting: Vascular Surgery

## 2013-03-10 ENCOUNTER — Encounter (INDEPENDENT_AMBULATORY_CARE_PROVIDER_SITE_OTHER): Payer: Medicare Other | Admitting: *Deleted

## 2013-03-10 ENCOUNTER — Ambulatory Visit: Payer: Medicare Other | Admitting: Vascular Surgery

## 2013-03-10 VITALS — BP 124/63 | HR 61 | Resp 16 | Ht 72.0 in | Wt 247.0 lb

## 2013-03-10 DIAGNOSIS — I83893 Varicose veins of bilateral lower extremities with other complications: Secondary | ICD-10-CM

## 2013-03-10 DIAGNOSIS — M79609 Pain in unspecified limb: Secondary | ICD-10-CM

## 2013-03-10 DIAGNOSIS — Z48812 Encounter for surgical aftercare following surgery on the circulatory system: Secondary | ICD-10-CM

## 2013-03-10 NOTE — Progress Notes (Signed)
Subjective:     Patient ID: Reginald [name], male   DOB: [birth year], 79 y.o.   MRN: [id number]  HPI this 79 year old male returns 1 week post laser ablation left great saphenous vein performed by Dr. early. Patient has venous hypertension with chronic edema and skin changes left leg. He has had a moderate amount of discomfort particularly at the bend of the knee medially. There has been no change in the edema. He continues to wear a long-leg elastic compression stockings.  Past Medical History  Diagnosis Date  . GERD (gastroesophageal reflux disease)   . CAD (coronary artery disease)     catheterization 2002,total LAD...single-vessel disease .Marland KitchenMarland KitchenEF 55-60% echo May,2008(EF had been 35% previously)...stress echo 2009...no definite ischemia..question of increased LVOT velocity of 66m/s not recorded RICA,60-79% LICA  . COPD (chronic obstructive pulmonary disease)   . Gouty arthritis   . Spinal stenosis   . Tobacco abuse   . Dyslipidemia   . Renal insufficiency   . Diabetes mellitus   . Gynecomastia     chronic  . Edema of lower extremity     chronic  . Asthma   . Carotid artery disease     Doppler, November, 2011, mild plaque, less than 50% bilateral  . Ejection fraction     Previously 35%, /  EF 55-60%,Echo, 2008  /  stress echo 2009 question increased LVOT velocity 3 m/s but not well recorded  . Atrial fibrillation     2010, Coumadin therapy  . Warfarin anticoagulation     Atrial fibrillation  . Varicose veins   . Leg pain, bilateral   . Leg swelling     History  Substance Use Topics  . Smoking status: Former Smoker -- 0.80 packs/day for 40 years    Types: Cigars    Quit date: 09/29/2008  . Smokeless tobacco: Never Used     Comment: quit cig 30-40 yrs ago / cigars - quit 2010  . Alcohol Use: No    Family History  Problem Relation Age of Onset  . Diabetes Mother     Allergies  Allergen Reactions  . Morphine     REACTION: hallucinations    Current outpatient  prescriptions:allopurinol (ZYLOPRIM) 100 MG tablet, Take 100 mg by mouth daily.  , Disp: , Rfl: ;  aspirin EC 81 MG tablet, Take 81 mg by mouth daily.  , Disp: , Rfl: ;  calcium-vitamin D (OSCAL WITH D) 500-200 MG-UNIT per tablet, Take 1 tablet by mouth 2 (two) times daily.  , Disp: , Rfl: ;  Colesevelam HCl (WELCHOL) 3.75 G PACK, Take 1 each by mouth daily.  , Disp: , Rfl:  colestipol (COLESTID) 1 G tablet, Take 1 g by mouth 2 (two) times daily.  , Disp: , Rfl: ;  fenofibrate (TRICOR) 145 MG tablet, Take 145 mg by mouth daily.  , Disp: , Rfl: ;  fish oil-omega-3 fatty acids 1000 MG capsule, Take 1 g by mouth 3 (three) times daily.  , Disp: , Rfl: ;  furosemide (LASIX) 40 MG tablet, Take 80mg  every morning & 40 mg every evening, Disp: , Rfl:  glyBURIDE (DIABETA) 5 MG tablet, Take 5 mg by mouth 2 (two) times daily with a meal.  , Disp: , Rfl: ;  isosorbide mononitrate (IMDUR) 60 MG 24 hr tablet, Take 60 mg by mouth daily.  , Disp: , Rfl: ;  lisinopril (PRINIVIL) 10 MG tablet, Take 1 tablet (10 mg total) by mouth daily., Disp: 30 tablet, Rfl: 3;  metoprolol (LOPRESSOR) 100 MG tablet, Take 1 tablet (100 mg total) by mouth 2 (two) times daily., Disp: 60 tablet, Rfl: 2 Multiple Vitamin (MULTIVITAMIN) tablet, Take 1 tablet by mouth daily.  , Disp: , Rfl: ;  Multiple Vitamins-Minerals (VISION FORMULA/LUTEIN) TABS, Take 1 tablet by mouth daily., Disp: , Rfl: ;  potassium chloride SA (K-DUR,KLOR-CON) 20 MEQ tablet, Take 1 tablet (20 mEq total) by mouth daily., Disp: 30 tablet, Rfl: 8;  pravastatin (PRAVACHOL) 80 MG tablet, daily., Disp: , Rfl:  ranitidine (ZANTAC) 150 MG tablet, Take 150 mg by mouth 2 (two) times daily.  , Disp: , Rfl: ;  warfarin (COUMADIN) 5 MG tablet, Take 5 mg by mouth as directed. Per Dr. Olena Leatherwood office, Disp: , Rfl:   BP 124/63  Pulse 61  Resp 16  Ht 6' (1.829 m)  Wt 247 lb (112.038 kg)  BMI 33.49 kg/m2  Body mass index is 33.49 kg/(m^2).           Review of Systems denies chest  pain, dyspnea on exertion, PND, orthopnea, hemoptysis     Objective:   Physical Exam blood pressure 124/63 heart rate 61 respirations 16 General well-developed well-nourished male in no apparent stress alert and oriented x3 Lungs no rhonchi or wheezing Left leg moderate discomfort along course of the great saphenous vein from proximal calf to saphenofemoral junction. 1+ chronic edema with thickening of skin but no active ulcers.  Today I ordered a venous duplex exam which I reviewed and interpreted. There is no DVT. There is total occlusion of the left great saphenous vein from the calf to near the saphenofemoral junction.    Assessment:     Successful laser ablation left great saphenous vein for venous hypertension    Plan:     Return June 26 for identical procedure and contralateral right leg

## 2013-03-18 ENCOUNTER — Ambulatory Visit (INDEPENDENT_AMBULATORY_CARE_PROVIDER_SITE_OTHER): Payer: Medicare Other | Admitting: Cardiology

## 2013-03-18 ENCOUNTER — Encounter: Payer: Self-pay | Admitting: Cardiology

## 2013-03-18 VITALS — BP 134/76 | HR 66 | Ht 72.0 in | Wt 242.4 lb

## 2013-03-18 DIAGNOSIS — I779 Disorder of arteries and arterioles, unspecified: Secondary | ICD-10-CM | POA: Diagnosis not present

## 2013-03-18 DIAGNOSIS — I2510 Atherosclerotic heart disease of native coronary artery without angina pectoris: Secondary | ICD-10-CM | POA: Diagnosis not present

## 2013-03-18 NOTE — Patient Instructions (Addendum)

## 2013-03-18 NOTE — Assessment & Plan Note (Signed)
Coronary status is stable. No change in therapy. 

## 2013-03-18 NOTE — Assessment & Plan Note (Signed)
His most recent Dopplers were quite stable.

## 2013-03-18 NOTE — Progress Notes (Signed)
Patient ID: Reginald [name], male   DOB: [birth year], 79 y.o.   MRN: [id number]   HPI  Patient is seen to followup his coronary disease. I saw him last Jan 31, 2013. Overall he appeared stable. There was a possibility of vascular procedures and we decided to reassess his cardiac status. Two-dimensional echo was done. His ejection fraction in fact was somewhat better than we had seen him last. He was up to the range of 55%. He also had a carotid Doppler showing stable minor disease.  He has had a vein procedure to his left leg. He says he feels he is improving. There is a procedure scheduled soon for his right leg. We are both hopeful that he will continue to do better.  Allergies  Allergen Reactions  . Morphine     REACTION: hallucinations    Current Outpatient Prescriptions  Medication Sig Dispense Refill  . allopurinol (ZYLOPRIM) 100 MG tablet Take 100 mg by mouth daily.        Marland Kitchen aspirin EC 81 MG tablet Take 81 mg by mouth daily.        . calcium-vitamin D (OSCAL WITH D) 500-200 MG-UNIT per tablet Take 1 tablet by mouth 2 (two) times daily.        . Colesevelam HCl (WELCHOL) 3.75 G PACK Take 1 each by mouth daily.        . colestipol (COLESTID) 1 G tablet Take 1 g by mouth 2 (two) times daily.        . fenofibrate (TRICOR) 145 MG tablet Take 145 mg by mouth daily.        . fish oil-omega-3 fatty acids 1000 MG capsule Take 1 g by mouth 3 (three) times daily.        . furosemide (LASIX) 40 MG tablet Take 80mg  every morning & 40 mg every evening      . glyBURIDE (DIABETA) 5 MG tablet Take 5 mg by mouth 2 (two) times daily with a meal.        . isosorbide mononitrate (IMDUR) 60 MG 24 hr tablet Take 60 mg by mouth daily.        Marland Kitchen lisinopril (PRINIVIL) 10 MG tablet Take 1 tablet (10 mg total) by mouth daily.  30 tablet  3  . metoprolol (LOPRESSOR) 100 MG tablet Take 1 tablet (100 mg total) by mouth 2 (two) times daily.  60 tablet  2  . Multiple Vitamin (MULTIVITAMIN) tablet Take 1 tablet by  mouth daily.        . Multiple Vitamins-Minerals (VISION FORMULA/LUTEIN) TABS Take 1 tablet by mouth daily.      . potassium chloride SA (K-DUR,KLOR-CON) 20 MEQ tablet Take 1 tablet (20 mEq total) by mouth daily.  30 tablet  8  . pravastatin (PRAVACHOL) 80 MG tablet Take 80 mg by mouth daily.       . ranitidine (ZANTAC) 150 MG tablet Take 150 mg by mouth 2 (two) times daily.        Marland Kitchen warfarin (COUMADIN) 5 MG tablet Take 5 mg by mouth as directed. Per Dr. Olena Leatherwood office       No current facility-administered medications for this visit.    History   Social History  . Marital Status: Widowed    Spouse Name: N/A    Number of Children: N/A  . Years of Education: N/A   Occupational History  . Not on file.   Social History Main Topics  . Smoking status: Former Smoker -- 0.80  packs/day for 40 years    Types: Cigars    Quit date: 09/29/2008  . Smokeless tobacco: Never Used     Comment: quit cig 30-40 yrs ago / cigars - quit 2010  . Alcohol Use: No  . Drug Use: No  . Sexually Active: Not on file   Other Topics Concern  . Not on file   Social History Narrative  . No narrative on file    Family History  Problem Relation Age of Onset  . Diabetes Mother     Past Medical History  Diagnosis Date  . GERD (gastroesophageal reflux disease)   . CAD (coronary artery disease)     catheterization 2002,total LAD...single-vessel disease .Marland KitchenMarland KitchenEF 55-60% echo May,2008(EF had been 35% previously)...stress echo 2009...no definite ischemia..question of increased LVOT velocity of 52m/s not recorded RICA,60-79% LICA  . COPD (chronic obstructive pulmonary disease)   . Gouty arthritis   . Spinal stenosis   . Tobacco abuse   . Dyslipidemia   . Renal insufficiency   . Diabetes mellitus   . Gynecomastia     chronic  . Edema of lower extremity     chronic  . Asthma   . Carotid artery disease     Doppler, November, 2011, mild plaque, less than 50% bilateral  . Ejection fraction     Previously  35%, /  EF 55-60%,Echo, 2008  /  stress echo 2009 question increased LVOT velocity 3 m/s but not well recorded  . Atrial fibrillation     2010, Coumadin therapy  . Warfarin anticoagulation     Atrial fibrillation  . Varicose veins   . Leg pain, bilateral   . Leg swelling     Past Surgical History  Procedure Laterality Date  . Cholecystectomy    . Eye surgery    . Hernia repair    . Endovenous ablation saphenous vein w/ laser Left 03-03-2013    endovenous laser ablation left greater saphenous vein by Gretta Began 03-03-2013    Patient Active Problem List   Diagnosis Date Noted  . Pain in limb 02/22/2013  . Varicose veins of lower extremities with other complications 08/17/2012  . Venous insufficiency 05/18/2012  . PVD (peripheral vascular disease) 05/18/2012  . CAD (coronary artery disease)   . Ejection fraction   . Edema of lower extremity   . Dyslipidemia   . Tobacco abuse   . COPD (chronic obstructive pulmonary disease)   . Renal insufficiency   . Asthma   . Carotid artery disease   . Warfarin anticoagulation   . Atrial fibrillation   . DM 08/07/2010  . GERD 08/07/2010  . GYNECOMASTIA 08/07/2010    ROS   Patient denies fever, chills, headache, sweats, rash, change in vision, change in hearing, chest pain, cough, nausea vomiting, urinary symptoms. All other systems are reviewed and are negative.  PHYSICAL EXAM  He is oriented to person time and place. Affect is normal. Lungs are clear. Respiratory effort is nonlabored. Cardiac exam reveals S1 and S2. The abdomen is soft. He has thigh length support hose on both legs.  Filed Vitals:   03/18/13 1047  BP: 134/76  Pulse: 66  Height: 6' (1.829 m)  Weight: 242 lb 6.4 oz (109.952 kg)  SpO2: 97%     ASSESSMENT & PLAN

## 2013-03-22 ENCOUNTER — Other Ambulatory Visit: Payer: Self-pay | Admitting: Cardiology

## 2013-03-23 ENCOUNTER — Encounter: Payer: Self-pay | Admitting: Vascular Surgery

## 2013-03-23 ENCOUNTER — Other Ambulatory Visit: Payer: Self-pay | Admitting: Cardiology

## 2013-03-23 MED ORDER — LISINOPRIL 10 MG PO TABS: 10.0000 mg | ORAL_TABLET | Freq: Every day | ORAL | Status: DC

## 2013-03-24 ENCOUNTER — Ambulatory Visit (INDEPENDENT_AMBULATORY_CARE_PROVIDER_SITE_OTHER): Payer: Medicare Other | Admitting: Vascular Surgery

## 2013-03-24 ENCOUNTER — Encounter: Payer: Self-pay | Admitting: Vascular Surgery

## 2013-03-24 VITALS — BP 133/72 | HR 60 | Resp 18 | Ht 72.0 in | Wt 242.0 lb

## 2013-03-24 DIAGNOSIS — I8310 Varicose veins of unspecified lower extremity with inflammation: Secondary | ICD-10-CM | POA: Diagnosis not present

## 2013-03-24 DIAGNOSIS — R609 Edema, unspecified: Secondary | ICD-10-CM

## 2013-03-24 DIAGNOSIS — M79609 Pain in unspecified limb: Secondary | ICD-10-CM

## 2013-03-24 DIAGNOSIS — I872 Venous insufficiency (chronic) (peripheral): Secondary | ICD-10-CM | POA: Diagnosis not present

## 2013-03-24 HISTORY — PX: ENDOVENOUS ABLATION SAPHENOUS VEIN W/ LASER: SUR449

## 2013-03-24 NOTE — Progress Notes (Signed)
Laser Ablation Procedure      Date: 03/24/2013    BASHIR MARCHETTI DOB:[id number]  Consent signed: Yes  Surgeon:T.F. Gabriele Zwilling  Procedure: Laser Ablation: right Greater Saphenous Vein  BP 133/72  Pulse 60  Resp 18  Ht 6' (1.829 m)  Wt 242 lb (109.77 kg)  BMI 32.81 kg/m2  Start time: 11:00AM   End time: 12:00PM  Tumescent Anesthesia: 450 cc 0.9% NaCl with 50 cc Lidocaine HCL with 1% Epi and 15 cc 8.4% NaHCO3  Local Anesthesia: 4 cc Lidocaine HCL and NaHCO3 (ratio 2:1)  Continuous Mode: 15 Watts  Total Energy 2403 Joules Total Time2:40    Patient tolerated procedure well: Yes  Notes: Last dose of Coumadin 03-18-2013.  Description of Procedure:  After marking the course of the saphenous vein and the secondary varicosities in the standing position, the patient was placed on the operating table in the supine position, and the right leg was prepped and draped in sterile fashion. Local anesthetic was administered, and under ultrasound guidance the saphenous vein was accessed with a micro needle and guide wire; then the micro puncture sheath was placed. A guide wire was inserted to the saphenofemoral junction, followed by a 5 french sheath.  The position of the sheath and then the laser fiber below the junction was confirmed using the ultrasound and visualization of the aiming beam.  Tumescent anesthesia was administered along the course of the saphenous vein using ultrasound guidance. Protective laser glasses were placed on the patient, and the laser was fired at at 15 watt continuous mode.  For a total of 2403 joules.  A steri strip was applied to the puncture site.      ABD pads and thigh high compression stockings were applied.  Ace wrap bandages were applied at the top of the saphenofemoral junction.  Blood loss was less than 15 cc.  The patient ambulated out of the operating room having tolerated the procedure well.

## 2013-03-28 ENCOUNTER — Telehealth: Payer: Self-pay | Admitting: *Deleted

## 2013-03-28 NOTE — Telephone Encounter (Signed)
03/28/2013  Time: 1:08 PM   Patient Name: Reginald [name]  Patient of: T.F. Early  Procedure:Laser Ablation right greater saphenous vein 03-24-2013  Reached patient at home and checked  His status  Yes    Comments/Actions Taken: [name] states he had 2 small spots of dried blood on his undershorts (near right upper groin) yesterday. No bleeding noted today.  Reassured him that small amount of bleeding/oozing is within normal limits for the endovenous laser ablation procedure especially since he is on Coumadin therapy.  (last dose 03-18-2013)  [name] states that ace wrap (outer layer) is feeling uncomfortable and snug.  Encouraged him to take ace wrap off and rewrap the ace bandage.  Reviewed post procedural instructions with him and reminded him of post laser ablation duplex and follow up with Dr. Arbie Cookey om 03-31-2013.    @SIGNATURE @

## 2013-03-30 ENCOUNTER — Encounter: Payer: Self-pay | Admitting: Vascular Surgery

## 2013-03-31 ENCOUNTER — Ambulatory Visit (INDEPENDENT_AMBULATORY_CARE_PROVIDER_SITE_OTHER): Payer: Medicare Other | Admitting: Vascular Surgery

## 2013-03-31 ENCOUNTER — Encounter (INDEPENDENT_AMBULATORY_CARE_PROVIDER_SITE_OTHER): Payer: Medicare Other | Admitting: *Deleted

## 2013-03-31 ENCOUNTER — Encounter: Payer: Self-pay | Admitting: Vascular Surgery

## 2013-03-31 VITALS — BP 122/69 | HR 61 | Resp 16 | Ht 72.0 in | Wt 241.0 lb

## 2013-03-31 DIAGNOSIS — M79609 Pain in unspecified limb: Secondary | ICD-10-CM | POA: Diagnosis not present

## 2013-03-31 DIAGNOSIS — Z48812 Encounter for surgical aftercare following surgery on the circulatory system: Secondary | ICD-10-CM | POA: Diagnosis not present

## 2013-03-31 DIAGNOSIS — I872 Venous insufficiency (chronic) (peripheral): Secondary | ICD-10-CM

## 2013-03-31 DIAGNOSIS — I83893 Varicose veins of bilateral lower extremities with other complications: Secondary | ICD-10-CM

## 2013-03-31 DIAGNOSIS — R609 Edema, unspecified: Secondary | ICD-10-CM

## 2013-03-31 NOTE — Progress Notes (Signed)
The patient has today for one week followup of his right leg laser ablation of her venous hypertension. He had his left leg procedure approximately one month prior. He did have more inflammation and discomfort in his right thigh that on his initial procedure on the left. This is resolving daily. He has minimal bruising.  Venous duplex today reveals closure of his right great saphenous vein to approximately 1-1/2 cm from the saphenofemoral junction and no evidence of the T.  Impression and plan successful staged bilateral laser ablation of great saphenous vein for symptom relief of venous hypertension. He does have known deep venous hypertension as well. He has marked hemosiderin changes of venous stasis changes most particularly on the right than on the left. Did explain that this would not be reversed by the procedure but hopefully slow its progression. He has been compliant with thigh-high graduated compression garments and will continue this. We will see him on an as-needed basis

## 2013-04-06 DIAGNOSIS — Z7901 Long term (current) use of anticoagulants: Secondary | ICD-10-CM | POA: Diagnosis not present

## 2013-04-06 DIAGNOSIS — I4891 Unspecified atrial fibrillation: Secondary | ICD-10-CM | POA: Diagnosis not present

## 2013-04-20 DIAGNOSIS — I4891 Unspecified atrial fibrillation: Secondary | ICD-10-CM | POA: Diagnosis not present

## 2013-04-28 DIAGNOSIS — M549 Dorsalgia, unspecified: Secondary | ICD-10-CM | POA: Diagnosis not present

## 2013-04-28 DIAGNOSIS — J209 Acute bronchitis, unspecified: Secondary | ICD-10-CM | POA: Diagnosis not present

## 2013-05-04 DIAGNOSIS — I4891 Unspecified atrial fibrillation: Secondary | ICD-10-CM | POA: Diagnosis not present

## 2013-05-18 DIAGNOSIS — I4891 Unspecified atrial fibrillation: Secondary | ICD-10-CM | POA: Diagnosis not present

## 2013-06-01 DIAGNOSIS — I4891 Unspecified atrial fibrillation: Secondary | ICD-10-CM | POA: Diagnosis not present

## 2013-06-22 DIAGNOSIS — I4891 Unspecified atrial fibrillation: Secondary | ICD-10-CM | POA: Diagnosis not present

## 2013-06-23 ENCOUNTER — Other Ambulatory Visit: Payer: Self-pay | Admitting: *Deleted

## 2013-06-23 DIAGNOSIS — Z23 Encounter for immunization: Secondary | ICD-10-CM | POA: Diagnosis not present

## 2013-06-23 MED ORDER — METOPROLOL TARTRATE 100 MG PO TABS: 100.0000 mg | ORAL_TABLET | Freq: Two times a day (BID) | ORAL | Status: DC

## 2013-07-13 DIAGNOSIS — I4891 Unspecified atrial fibrillation: Secondary | ICD-10-CM | POA: Diagnosis not present

## 2013-07-29 DIAGNOSIS — E782 Mixed hyperlipidemia: Secondary | ICD-10-CM | POA: Diagnosis not present

## 2013-07-29 DIAGNOSIS — E119 Type 2 diabetes mellitus without complications: Secondary | ICD-10-CM | POA: Diagnosis not present

## 2013-07-29 DIAGNOSIS — I4891 Unspecified atrial fibrillation: Secondary | ICD-10-CM | POA: Diagnosis not present

## 2013-07-29 DIAGNOSIS — I10 Essential (primary) hypertension: Secondary | ICD-10-CM | POA: Diagnosis not present

## 2013-08-17 DIAGNOSIS — I4891 Unspecified atrial fibrillation: Secondary | ICD-10-CM | POA: Diagnosis not present

## 2013-09-07 DIAGNOSIS — I4891 Unspecified atrial fibrillation: Secondary | ICD-10-CM | POA: Diagnosis not present

## 2013-10-04 DIAGNOSIS — I83893 Varicose veins of bilateral lower extremities with other complications: Secondary | ICD-10-CM

## 2013-10-05 DIAGNOSIS — I4891 Unspecified atrial fibrillation: Secondary | ICD-10-CM | POA: Diagnosis not present

## 2013-10-19 ENCOUNTER — Other Ambulatory Visit: Payer: Self-pay | Admitting: Cardiology

## 2013-11-07 DIAGNOSIS — I4891 Unspecified atrial fibrillation: Secondary | ICD-10-CM | POA: Diagnosis not present

## 2013-11-07 DIAGNOSIS — I10 Essential (primary) hypertension: Secondary | ICD-10-CM | POA: Diagnosis not present

## 2013-11-07 DIAGNOSIS — IMO0001 Reserved for inherently not codable concepts without codable children: Secondary | ICD-10-CM | POA: Diagnosis not present

## 2013-11-07 DIAGNOSIS — Z0000 Encounter for general adult medical examination without abnormal findings: Secondary | ICD-10-CM | POA: Diagnosis not present

## 2013-11-07 DIAGNOSIS — E782 Mixed hyperlipidemia: Secondary | ICD-10-CM | POA: Diagnosis not present

## 2013-11-30 DIAGNOSIS — I4891 Unspecified atrial fibrillation: Secondary | ICD-10-CM | POA: Diagnosis not present

## 2013-12-28 DIAGNOSIS — IMO0001 Reserved for inherently not codable concepts without codable children: Secondary | ICD-10-CM | POA: Diagnosis not present

## 2013-12-28 DIAGNOSIS — Z7901 Long term (current) use of anticoagulants: Secondary | ICD-10-CM | POA: Diagnosis not present

## 2014-01-31 ENCOUNTER — Other Ambulatory Visit: Payer: Self-pay | Admitting: *Deleted

## 2014-01-31 MED ORDER — METOPROLOL TARTRATE 100 MG PO TABS: 100.0000 mg | ORAL_TABLET | Freq: Two times a day (BID) | ORAL | Status: DC

## 2014-02-01 DIAGNOSIS — I4891 Unspecified atrial fibrillation: Secondary | ICD-10-CM | POA: Diagnosis not present

## 2014-02-08 DIAGNOSIS — I10 Essential (primary) hypertension: Secondary | ICD-10-CM | POA: Diagnosis not present

## 2014-02-08 DIAGNOSIS — E119 Type 2 diabetes mellitus without complications: Secondary | ICD-10-CM | POA: Diagnosis not present

## 2014-02-08 DIAGNOSIS — E782 Mixed hyperlipidemia: Secondary | ICD-10-CM | POA: Diagnosis not present

## 2014-02-09 DIAGNOSIS — M810 Age-related osteoporosis without current pathological fracture: Secondary | ICD-10-CM | POA: Diagnosis not present

## 2014-02-09 DIAGNOSIS — Z7901 Long term (current) use of anticoagulants: Secondary | ICD-10-CM | POA: Diagnosis not present

## 2014-02-09 DIAGNOSIS — Z79899 Other long term (current) drug therapy: Secondary | ICD-10-CM | POA: Diagnosis not present

## 2014-02-09 DIAGNOSIS — Z13820 Encounter for screening for osteoporosis: Secondary | ICD-10-CM | POA: Diagnosis not present

## 2014-02-22 DIAGNOSIS — I4891 Unspecified atrial fibrillation: Secondary | ICD-10-CM | POA: Diagnosis not present

## 2014-03-09 ENCOUNTER — Encounter: Payer: Self-pay | Admitting: Cardiology

## 2014-03-13 ENCOUNTER — Encounter: Payer: Self-pay | Admitting: Cardiology

## 2014-03-13 ENCOUNTER — Ambulatory Visit (INDEPENDENT_AMBULATORY_CARE_PROVIDER_SITE_OTHER): Payer: Medicare Other | Admitting: Cardiology

## 2014-03-13 VITALS — BP 110/63 | HR 61 | Ht 71.0 in | Wt 217.8 lb

## 2014-03-13 DIAGNOSIS — I739 Peripheral vascular disease, unspecified: Secondary | ICD-10-CM

## 2014-03-13 DIAGNOSIS — E785 Hyperlipidemia, unspecified: Secondary | ICD-10-CM

## 2014-03-13 DIAGNOSIS — I4891 Unspecified atrial fibrillation: Secondary | ICD-10-CM

## 2014-03-13 DIAGNOSIS — R600 Localized edema: Secondary | ICD-10-CM

## 2014-03-13 DIAGNOSIS — I2510 Atherosclerotic heart disease of native coronary artery without angina pectoris: Secondary | ICD-10-CM

## 2014-03-13 DIAGNOSIS — R609 Edema, unspecified: Secondary | ICD-10-CM

## 2014-03-13 DIAGNOSIS — I779 Disorder of arteries and arterioles, unspecified: Secondary | ICD-10-CM

## 2014-03-13 NOTE — Assessment & Plan Note (Signed)
Today he has sinus rhythm. He is anticoagulated.

## 2014-03-13 NOTE — Patient Instructions (Signed)

## 2014-03-13 NOTE — Assessment & Plan Note (Signed)
His leg edema is greatly improved after he had vein surgery.

## 2014-03-13 NOTE — Assessment & Plan Note (Signed)
His carotids are being followed by Doppler appropriately. No change in therapy.

## 2014-03-13 NOTE — Assessment & Plan Note (Signed)
Coronary disease is stable. He is on appropriate medications. No further workup is needed.

## 2014-03-13 NOTE — Progress Notes (Signed)
Patient ID: Reginald [name], male   DOB: August 01, 1930, 80 y.o.   MRN: [id number]    HPI  Patient is seen today to followup coronary disease. I saw him last in June, 2014. He has been stable. In addition he's had a very good result from the procedure that he had done on his veins. He wears his support hose on his legs every day. He's had followup with vascular surgery and is doing well. In addition he has not had any significant chest pain.  Allergies  Allergen Reactions  . Morphine     REACTION: hallucinations    Current Outpatient Prescriptions  Medication Sig Dispense Refill  . allopurinol (ZYLOPRIM) 100 MG tablet Take 100 mg by mouth daily.        Marland Kitchen aspirin EC 81 MG tablet Take 81 mg by mouth daily.        . calcium-vitamin D (OSCAL WITH D) 500-200 MG-UNIT per tablet Take 1 tablet by mouth 2 (two) times daily.        . Colesevelam HCl (WELCHOL) 3.75 G PACK Take 1 each by mouth daily.        . colestipol (COLESTID) 1 G tablet Take 1 g by mouth 2 (two) times daily.        . fenofibrate (TRICOR) 145 MG tablet Take 145 mg by mouth daily.        . fish oil-omega-3 fatty acids 1000 MG capsule Take 1 g by mouth 3 (three) times daily.        . furosemide (LASIX) 40 MG tablet Take 80mg  every morning & 40 mg every evening      . glyBURIDE (DIABETA) 5 MG tablet Take 5 mg by mouth 2 (two) times daily with a meal.        . isosorbide mononitrate (IMDUR) 60 MG 24 hr tablet Take 60 mg by mouth daily.        Marland Kitchen lisinopril (PRINIVIL,ZESTRIL) 10 MG tablet TAKE 1 TABLET BY MOUTH DAILY  30 tablet  6  . metoprolol (LOPRESSOR) 100 MG tablet Take 1 tablet (100 mg total) by mouth 2 (two) times daily.  60 tablet  6  . Multiple Vitamin (MULTIVITAMIN) tablet Take 1 tablet by mouth daily.        . Multiple Vitamins-Minerals (VISION FORMULA/LUTEIN) TABS Take 1 tablet by mouth daily.      . potassium chloride SA (K-DUR,KLOR-CON) 20 MEQ tablet Take 20 mEq by mouth daily.      . pravastatin (PRAVACHOL) 80 MG tablet  Take 80 mg by mouth daily.       . ranitidine (ZANTAC) 150 MG tablet Take 150 mg by mouth 2 (two) times daily.        Marland Kitchen warfarin (COUMADIN) 5 MG tablet Take 5 mg by mouth as directed. Per Dr. Sherrie Sport office             Prior to laser ablation on 03-24-2013 last dose taken 03-18-2013.       No current facility-administered medications for this visit.    History   Social History  . Marital Status: Widowed    Spouse Name: N/A    Number of Children: N/A  . Years of Education: N/A   Occupational History  . Not on file.   Social History Main Topics  . Smoking status: Former Smoker -- 0.80 packs/day for 40 years    Types: Cigars    Quit date: 09/29/2008  . Smokeless tobacco: Never Used     Comment:  quit cig 30-40 yrs ago / cigars - quit 2010  . Alcohol Use: No  . Drug Use: No  . Sexual Activity: Not on file   Other Topics Concern  . Not on file   Social History Narrative  . No narrative on file    Family History  Problem Relation Age of Onset  . Diabetes Mother     Past Medical History  Diagnosis Date  . GERD (gastroesophageal reflux disease)   . CAD (coronary artery disease)     catheterization 2002,total LAD...single-vessel disease .Marland KitchenMarland KitchenEF 55-60% echo May,2008(EF had been 35% previously)...stress echo 2009...no definite ischemia..question of increased LVOT velocity of 39m/s not recorded NIDP,82-42% LICA  . COPD (chronic obstructive pulmonary disease)   . Gouty arthritis   . Spinal stenosis   . Tobacco abuse   . Dyslipidemia   . Renal insufficiency   . Diabetes mellitus   . Gynecomastia     chronic  . Edema of lower extremity     chronic  . Asthma   . Carotid artery disease     Doppler, November, 2011, mild plaque, less than 50% bilateral  . Ejection fraction     Previously 35%, /  EF 55-60%,Echo, 2008  /  stress echo 2009 question increased LVOT velocity 3 m/s but not well recorded  . Atrial fibrillation     2010, Coumadin therapy  . Warfarin anticoagulation      Atrial fibrillation  . Varicose veins   . Leg pain, bilateral   . Leg swelling     Past Surgical History  Procedure Laterality Date  . Cholecystectomy    . Eye surgery    . Hernia repair    . Endovenous ablation saphenous vein w/ laser Left 03-03-2013    endovenous laser ablation left greater saphenous vein by Curt Jews 03-03-2013  . Endovenous ablation saphenous vein w/ laser Right 03-24-2013    right greater saphenous vein  by Curt Jews MD    Patient Active Problem List   Diagnosis Date Noted  . Edema 03/24/2013  . Pain in limb 02/22/2013  . Varicose veins of lower extremities with other complications 35/36/1443  . PVD (peripheral vascular disease) 05/18/2012  . CAD (coronary artery disease)   . Ejection fraction   . Edema of lower extremity   . Dyslipidemia   . Tobacco abuse   . COPD (chronic obstructive pulmonary disease)   . Renal insufficiency   . Asthma   . Carotid artery disease   . Warfarin anticoagulation   . Atrial fibrillation   . DM 08/07/2010  . GERD 08/07/2010  . GYNECOMASTIA 08/07/2010    ROS   Patient denies fever, chills, headache, sweats, rash, change in vision, change in hearing, chest pain, cough, nausea or vomiting, urinary symptoms. All other systems are reviewed and are negative.  PHYSICAL EXAM   Patient is stable. Head is atraumatic. His hair is blond. Sclera and conjunctiva are normal. There is no jugulovenous distention. Lungs are clear. Respiratory effort is nonlabored. Cardiac exam reveals S1 and S2. There no clicks or significant murmurs. The abdomen is soft. He is wearing support hose on his legs with no significant edema at this time. There no musculoskeletal deformities. There are no skin rashes.  Filed Vitals:   03/13/14 1030  BP: 110/63  Pulse: 61  Height: 5\' 11"  (1.803 m)  Weight: 217 lb 12.8 oz (98.793 kg)  SpO2: 97%   EKG is done today and reviewed by me. There is sinus rhythm.  There is decreased anterior R wave  progression. There is no change from the past.  ASSESSMENT & PLAN

## 2014-03-13 NOTE — Assessment & Plan Note (Signed)
His lipids are treated. No change in therapy.

## 2014-03-23 ENCOUNTER — Other Ambulatory Visit: Payer: Self-pay | Admitting: *Deleted

## 2014-03-23 DIAGNOSIS — I4891 Unspecified atrial fibrillation: Secondary | ICD-10-CM | POA: Diagnosis not present

## 2014-03-23 MED ORDER — LISINOPRIL 10 MG PO TABS: 10.0000 mg | ORAL_TABLET | Freq: Every day | ORAL | Status: DC

## 2014-04-05 DIAGNOSIS — I4891 Unspecified atrial fibrillation: Secondary | ICD-10-CM | POA: Diagnosis not present

## 2014-04-17 DIAGNOSIS — E119 Type 2 diabetes mellitus without complications: Secondary | ICD-10-CM | POA: Diagnosis not present

## 2014-04-18 ENCOUNTER — Other Ambulatory Visit: Payer: Self-pay | Admitting: *Deleted

## 2014-04-18 DIAGNOSIS — I739 Peripheral vascular disease, unspecified: Principal | ICD-10-CM

## 2014-04-18 DIAGNOSIS — I779 Disorder of arteries and arterioles, unspecified: Secondary | ICD-10-CM

## 2014-04-19 DIAGNOSIS — I4891 Unspecified atrial fibrillation: Secondary | ICD-10-CM | POA: Diagnosis not present

## 2014-05-10 DIAGNOSIS — I4891 Unspecified atrial fibrillation: Secondary | ICD-10-CM | POA: Diagnosis not present

## 2014-05-18 DIAGNOSIS — L570 Actinic keratosis: Secondary | ICD-10-CM | POA: Diagnosis not present

## 2014-05-18 DIAGNOSIS — E782 Mixed hyperlipidemia: Secondary | ICD-10-CM | POA: Diagnosis not present

## 2014-05-18 DIAGNOSIS — IMO0001 Reserved for inherently not codable concepts without codable children: Secondary | ICD-10-CM | POA: Diagnosis not present

## 2014-05-18 DIAGNOSIS — L989 Disorder of the skin and subcutaneous tissue, unspecified: Secondary | ICD-10-CM | POA: Diagnosis not present

## 2014-05-18 DIAGNOSIS — Z125 Encounter for screening for malignant neoplasm of prostate: Secondary | ICD-10-CM | POA: Diagnosis not present

## 2014-05-18 DIAGNOSIS — C44221 Squamous cell carcinoma of skin of unspecified ear and external auricular canal: Secondary | ICD-10-CM | POA: Diagnosis not present

## 2014-05-24 DIAGNOSIS — Z1211 Encounter for screening for malignant neoplasm of colon: Secondary | ICD-10-CM | POA: Diagnosis not present

## 2014-05-24 DIAGNOSIS — I4891 Unspecified atrial fibrillation: Secondary | ICD-10-CM | POA: Diagnosis not present

## 2014-05-29 DIAGNOSIS — H61899 Other specified disorders of external ear, unspecified ear: Secondary | ICD-10-CM | POA: Diagnosis not present

## 2014-06-07 DIAGNOSIS — I4891 Unspecified atrial fibrillation: Secondary | ICD-10-CM | POA: Diagnosis not present

## 2014-06-26 DIAGNOSIS — R195 Other fecal abnormalities: Secondary | ICD-10-CM | POA: Diagnosis not present

## 2014-06-28 DIAGNOSIS — I4891 Unspecified atrial fibrillation: Secondary | ICD-10-CM | POA: Diagnosis not present

## 2014-07-10 DIAGNOSIS — Z7901 Long term (current) use of anticoagulants: Secondary | ICD-10-CM | POA: Diagnosis not present

## 2014-07-10 DIAGNOSIS — Z885 Allergy status to narcotic agent status: Secondary | ICD-10-CM | POA: Diagnosis not present

## 2014-07-10 DIAGNOSIS — R195 Other fecal abnormalities: Secondary | ICD-10-CM | POA: Diagnosis not present

## 2014-07-10 DIAGNOSIS — D123 Benign neoplasm of transverse colon: Secondary | ICD-10-CM | POA: Diagnosis not present

## 2014-07-10 DIAGNOSIS — Z7982 Long term (current) use of aspirin: Secondary | ICD-10-CM | POA: Diagnosis not present

## 2014-07-10 DIAGNOSIS — R194 Change in bowel habit: Secondary | ICD-10-CM | POA: Diagnosis not present

## 2014-07-10 DIAGNOSIS — R197 Diarrhea, unspecified: Secondary | ICD-10-CM | POA: Diagnosis not present

## 2014-07-10 DIAGNOSIS — D125 Benign neoplasm of sigmoid colon: Secondary | ICD-10-CM | POA: Diagnosis not present

## 2014-07-10 DIAGNOSIS — Z79899 Other long term (current) drug therapy: Secondary | ICD-10-CM | POA: Diagnosis not present

## 2014-07-10 DIAGNOSIS — E119 Type 2 diabetes mellitus without complications: Secondary | ICD-10-CM | POA: Diagnosis not present

## 2014-07-10 DIAGNOSIS — I10 Essential (primary) hypertension: Secondary | ICD-10-CM | POA: Diagnosis not present

## 2014-07-18 DIAGNOSIS — K635 Polyp of colon: Secondary | ICD-10-CM | POA: Diagnosis not present

## 2014-07-26 DIAGNOSIS — Z7901 Long term (current) use of anticoagulants: Secondary | ICD-10-CM | POA: Diagnosis not present

## 2014-08-04 DIAGNOSIS — Z23 Encounter for immunization: Secondary | ICD-10-CM | POA: Diagnosis not present

## 2014-08-14 DIAGNOSIS — Z7901 Long term (current) use of anticoagulants: Secondary | ICD-10-CM | POA: Diagnosis not present

## 2014-08-14 DIAGNOSIS — E782 Mixed hyperlipidemia: Secondary | ICD-10-CM | POA: Diagnosis not present

## 2014-08-14 DIAGNOSIS — Z79899 Other long term (current) drug therapy: Secondary | ICD-10-CM | POA: Diagnosis not present

## 2014-08-14 DIAGNOSIS — M179 Osteoarthritis of knee, unspecified: Secondary | ICD-10-CM | POA: Diagnosis not present

## 2014-08-30 DIAGNOSIS — Z7901 Long term (current) use of anticoagulants: Secondary | ICD-10-CM | POA: Diagnosis not present

## 2014-09-13 DIAGNOSIS — Z7901 Long term (current) use of anticoagulants: Secondary | ICD-10-CM | POA: Diagnosis not present

## 2014-09-19 ENCOUNTER — Other Ambulatory Visit: Payer: Self-pay | Admitting: Cardiology

## 2014-09-28 DIAGNOSIS — Z7901 Long term (current) use of anticoagulants: Secondary | ICD-10-CM | POA: Diagnosis not present

## 2014-10-18 DIAGNOSIS — Z7901 Long term (current) use of anticoagulants: Secondary | ICD-10-CM | POA: Diagnosis not present

## 2014-11-08 DIAGNOSIS — Z7901 Long term (current) use of anticoagulants: Secondary | ICD-10-CM | POA: Diagnosis not present

## 2014-11-20 DIAGNOSIS — I10 Essential (primary) hypertension: Secondary | ICD-10-CM | POA: Diagnosis not present

## 2014-11-20 DIAGNOSIS — Z0000 Encounter for general adult medical examination without abnormal findings: Secondary | ICD-10-CM | POA: Diagnosis not present

## 2014-11-20 DIAGNOSIS — Z7901 Long term (current) use of anticoagulants: Secondary | ICD-10-CM | POA: Diagnosis not present

## 2014-11-20 DIAGNOSIS — Z1389 Encounter for screening for other disorder: Secondary | ICD-10-CM | POA: Diagnosis not present

## 2014-11-20 DIAGNOSIS — M179 Osteoarthritis of knee, unspecified: Secondary | ICD-10-CM | POA: Diagnosis not present

## 2014-11-20 DIAGNOSIS — E1165 Type 2 diabetes mellitus with hyperglycemia: Secondary | ICD-10-CM | POA: Diagnosis not present

## 2014-11-22 ENCOUNTER — Telehealth: Payer: Self-pay

## 2014-11-22 NOTE — Telephone Encounter (Signed)
Numerous recall letters mailed to patient. Called telephone # no answer.  Reginald [name] [[id number]] 04/25/2014 2:25 PM New [10]   Reginald [name] [[id number]] 04/25/2014 2:27 PM Notification Sent [20]   Reginald [name] [[id number]] 07/04/2014 9:49 AM Notification Sent [20]   Reginald [name] [[id number]] 08/22/2014 9:12 AM Notification Sent [20]   Reginald [name] [[id number]] 10/19/2014 2:23 PM Notification Sent [20]

## 2014-12-13 DIAGNOSIS — Z7901 Long term (current) use of anticoagulants: Secondary | ICD-10-CM | POA: Diagnosis not present

## 2014-12-26 ENCOUNTER — Other Ambulatory Visit: Payer: Self-pay | Admitting: *Deleted

## 2014-12-26 MED ORDER — LISINOPRIL 10 MG PO TABS: 10.0000 mg | ORAL_TABLET | Freq: Every day | ORAL | Status: DC

## 2014-12-26 NOTE — Telephone Encounter (Signed)
Lisinopril refilled today.  1 year f/u 12/29/14 with Dr. Ron Parker already scheduled.

## 2014-12-29 ENCOUNTER — Ambulatory Visit (INDEPENDENT_AMBULATORY_CARE_PROVIDER_SITE_OTHER): Payer: Medicare Other | Admitting: Cardiology

## 2014-12-29 ENCOUNTER — Encounter: Payer: Self-pay | Admitting: *Deleted

## 2014-12-29 VITALS — BP 144/86 | HR 86 | Ht 72.0 in | Wt 216.8 lb

## 2014-12-29 DIAGNOSIS — I739 Peripheral vascular disease, unspecified: Secondary | ICD-10-CM

## 2014-12-29 DIAGNOSIS — R0602 Shortness of breath: Secondary | ICD-10-CM

## 2014-12-29 DIAGNOSIS — R0989 Other specified symptoms and signs involving the circulatory and respiratory systems: Secondary | ICD-10-CM

## 2014-12-29 DIAGNOSIS — I2510 Atherosclerotic heart disease of native coronary artery without angina pectoris: Secondary | ICD-10-CM

## 2014-12-29 DIAGNOSIS — R918 Other nonspecific abnormal finding of lung field: Secondary | ICD-10-CM | POA: Diagnosis not present

## 2014-12-29 DIAGNOSIS — J069 Acute upper respiratory infection, unspecified: Secondary | ICD-10-CM

## 2014-12-29 DIAGNOSIS — R05 Cough: Secondary | ICD-10-CM

## 2014-12-29 DIAGNOSIS — I482 Chronic atrial fibrillation, unspecified: Secondary | ICD-10-CM

## 2014-12-29 DIAGNOSIS — R9430 Abnormal result of cardiovascular function study, unspecified: Secondary | ICD-10-CM

## 2014-12-29 DIAGNOSIS — Z7901 Long term (current) use of anticoagulants: Secondary | ICD-10-CM

## 2014-12-29 DIAGNOSIS — R059 Cough, unspecified: Secondary | ICD-10-CM

## 2014-12-29 NOTE — Assessment & Plan Note (Signed)
The patient continues on Coumadin for his atrial fibrillation.  As part of today's evaluation I spent greater than 25 minutes with the patient's total care. More than half of this time is been with direct contact with him counseling him about his leg discomfort and shortness of breath and his upper respiratory infection.

## 2014-12-29 NOTE — Assessment & Plan Note (Signed)
The patient is having leg symptoms with walking. At this point, it seems that this may be claudication. Arterial leg Dopplers will be done. He has a history of significant venous disease of the legs.

## 2014-12-29 NOTE — Assessment & Plan Note (Signed)
The patient will contact his primary physician for early follow-up. He says that he has an illness like this every spurring. Chest x-ray will be done with the report sent to his primary physician.

## 2014-12-29 NOTE — Assessment & Plan Note (Signed)
There is chronic atrial fibrillation. Rate is controlled. Patient is on Coumadin.

## 2014-12-29 NOTE — Assessment & Plan Note (Signed)
Coronary disease has been stable. He is not having any chest pain. No further workup at this time.

## 2014-12-29 NOTE — Assessment & Plan Note (Signed)
The patient is having some shortness of breath. It is not clear if this is related to his upper respiratory infection or ischemia or change in LV function. He will have a 2-D echo for further assessment.

## 2014-12-29 NOTE — Patient Instructions (Signed)
Your physician recommends that you schedule a follow-up appointment in: January 18, 2015 with Dr. Ron Parker. Your physician recommends that you continue on your current medications as directed. Please refer to the Current Medication list given to you today. A chest x-ray takes a picture of the organs and structures inside the chest, including the heart, lungs, and blood vessels. This test can show several things, including, whether the heart is enlarges; whether fluid is building up in the lungs; and whether pacemaker / defibrillator leads are still in place. Your physician has requested that you have an echocardiogram. Echocardiography is a painless test that uses sound waves to create images of your heart. It provides your doctor with information about the size and shape of your heart and how well your heart's chambers and valves are working. This procedure takes approximately one hour. There are no restrictions for this procedure. Your physician has requested that you have a lower or upper extremity arterial duplex. This test is an ultrasound of the arteries in the legs or arms. It looks at arterial blood flow in the legs and arms. Allow one hour for Lower and Upper Arterial scans. There are no restrictions or special instructions. Please see Dr. Telford Nab soon.

## 2014-12-29 NOTE — Progress Notes (Signed)
Cardiology Office Note   Date:  12/29/2014   ID:  Reginald [name], DOB 11/27/1929, MRN [id number]  PCP:  Neale Burly, MD  Cardiologist:  Dola Argyle, MD   Chief Complaint  Patient presents with  . Appointment    Follow-up coronary artery disease      History of Present Illness: Reginald [name] is a 81 y.o. male who presents today to follow-up coronary disease. I saw him last June, 2015. He was stable. He does have documented coronary disease. He has had very significant venous disease in his legs that has been treated successfully over time. He has persistent mild edema and he usually wears support hose. He did not wear the support hose today so that I could examine him fully.  He mentions that he has had some weakness in his legs with discomfort. He says that he then has shortness of breath after feeling this weakness. He is not having any chest pain. He's had no syncope or presyncope.  The patient has also had an upper respiratory infection. He's had a cough with this. He does not describe significant sputum production.  Past Medical History  Diagnosis Date  . GERD (gastroesophageal reflux disease)   . CAD (coronary artery disease)     catheterization 2002,total LAD...single-vessel disease .Marland KitchenMarland KitchenEF 55-60% echo May,2008(EF had been 35% previously)...stress echo 2009...no definite ischemia..question of increased LVOT velocity of 75m/s not recorded PZWC,58-52% LICA  . COPD (chronic obstructive pulmonary disease)   . Gouty arthritis   . Spinal stenosis   . Tobacco abuse   . Dyslipidemia   . Renal insufficiency   . Diabetes mellitus   . Gynecomastia     chronic  . Edema of lower extremity     chronic  . Asthma   . Carotid artery disease     Doppler, November, 2011, mild plaque, less than 50% bilateral  . Ejection fraction     Previously 35%, /  EF 55-60%,Echo, 2008  /  stress echo 2009 question increased LVOT velocity 3 m/s but not well recorded  . Atrial fibrillation    2010, Coumadin therapy  . Warfarin anticoagulation     Atrial fibrillation  . Varicose veins   . Leg pain, bilateral   . Leg swelling     Past Surgical History  Procedure Laterality Date  . Cholecystectomy    . Eye surgery    . Hernia repair    . Endovenous ablation saphenous vein w/ laser Left 03-03-2013    endovenous laser ablation left greater saphenous vein by Curt Jews 03-03-2013  . Endovenous ablation saphenous vein w/ laser Right 03-24-2013    right greater saphenous vein  by Curt Jews MD    Patient Active Problem List   Diagnosis Date Noted  . Pain in limb 02/22/2013  . Varicose veins of lower extremities with other complications 77/82/4235  . PVD (peripheral vascular disease) 05/18/2012  . CAD (coronary artery disease)   . Ejection fraction   . Edema of lower extremity   . Dyslipidemia   . Tobacco abuse   . COPD (chronic obstructive pulmonary disease)   . Renal insufficiency   . Asthma   . Carotid artery disease   . Warfarin anticoagulation   . Atrial fibrillation   . DM 08/07/2010  . GERD 08/07/2010  . GYNECOMASTIA 08/07/2010      Current Outpatient Prescriptions  Medication Sig Dispense Refill  . allopurinol (ZYLOPRIM) 100 MG tablet Take 100 mg by mouth daily.      Marland Kitchen  calcium-vitamin D (OSCAL WITH D) 500-200 MG-UNIT per tablet Take 1 tablet by mouth 2 (two) times daily.      . Colesevelam HCl (WELCHOL) 3.75 G PACK Take 1 each by mouth daily.      . colestipol (COLESTID) 1 G tablet Take 1 g by mouth 2 (two) times daily.      . fish oil-omega-3 fatty acids 1000 MG capsule Take 1 g by mouth 3 (three) times daily.      . furosemide (LASIX) 40 MG tablet Take 80mg  every morning & 40 mg every evening    . glimepiride (AMARYL) 4 MG tablet Take 4 mg by mouth daily with breakfast.    . isosorbide mononitrate (IMDUR) 60 MG 24 hr tablet Take 60 mg by mouth daily.      Marland Kitchen lisinopril (PRINIVIL,ZESTRIL) 10 MG tablet Take 1 tablet (10 mg total) by mouth daily. 30 tablet  6  . metFORMIN (GLUCOPHAGE) 500 MG tablet Take 500 mg by mouth 2 (two) times daily with a meal.    . metoprolol (LOPRESSOR) 100 MG tablet TAKE 1 TABLET BY MOUTH TWICE DAILY 60 tablet 6  . Multiple Vitamin (MULTIVITAMIN) tablet Take 1 tablet by mouth daily.      . Multiple Vitamins-Minerals (VISION FORMULA/LUTEIN) TABS Take 1 tablet by mouth daily.    . potassium chloride SA (K-DUR,KLOR-CON) 20 MEQ tablet Take 20 mEq by mouth daily.    . ranitidine (ZANTAC) 150 MG tablet Take 150 mg by mouth 2 (two) times daily.      . traMADol (ULTRAM) 50 MG tablet Take 50 mg by mouth 2 (two) times daily.    Marland Kitchen warfarin (COUMADIN) 5 MG tablet Take 5 mg by mouth as directed. Per Dr. Sherrie Sport office             Prior to laser ablation on 03-24-2013 last dose taken 03-18-2013.     No current facility-administered medications for this visit.    Allergies:   Morphine    Social History:  The patient  reports that he quit smoking about 6 years ago. His smoking use included Cigars. He has never used smokeless tobacco. He reports that he does not drink alcohol or use illicit drugs.   Family History:  The patient's family history includes Diabetes in his mother; Hypertension in his mother.    ROS:  Please see the history of present illness.     Patient denies fever, chills, headache, sweats, rash, change in vision, change in hearing, chest pain. He has had a cough. He's had no GI or urinary tract symptoms. All other systems are reviewed and are negative.   PHYSICAL EXAM: VS:  BP 144/86 mmHg  Pulse 86  Ht 6' (1.829 m)  Wt 216 lb 12.8 oz (98.34 kg)  BMI 29.40 kg/m2  SpO2 98% ,  The patient is stable. He is walking with a cane. He has a Ruby appearance to his skin as always. Head is atraumatic. Sclera and conjunctiva are normal. There is no jugular venous distention. Lungs reveal scattered rhonchi and a few wheezes. There is no respiratory distress. Cardiac exam reveals S1 and S2. The abdomen is soft. He has 1+  bilateral peripheral edema. He's walking with a cane. He has no significant skin rashes. Neurologic is grossly intact.   EKG:   EKG is not done today.   Recent Labs: No results found for requested labs within last 365 days.    Lipid Panel No results found for: CHOL, TRIG, HDL, CHOLHDL,  VLDL, LDLCALC, LDLDIRECT    Wt Readings from Last 3 Encounters:  12/29/14 216 lb 12.8 oz (98.34 kg)  03/13/14 217 lb 12.8 oz (98.793 kg)  03/31/13 241 lb (109.317 kg)      Current medicines are reviewed  The patient understands his medications.     ASSESSMENT AND PLAN:

## 2014-12-29 NOTE — Assessment & Plan Note (Signed)
It is not yet clear if his shortness of breath is due to his current respiratory illness or whether there is a cardiac component. I've ordered a chest x-ray. The result will be sent also to Dr. Sherrie Sport.

## 2014-12-31 DIAGNOSIS — T1490 Injury, unspecified: Secondary | ICD-10-CM | POA: Diagnosis not present

## 2015-01-03 ENCOUNTER — Encounter: Payer: Self-pay | Admitting: *Deleted

## 2015-01-03 ENCOUNTER — Telehealth: Payer: Self-pay | Admitting: *Deleted

## 2015-01-03 MED ORDER — FUROSEMIDE 40 MG PO TABS: 80.0000 mg | ORAL_TABLET | Freq: Two times a day (BID) | ORAL | Status: DC

## 2015-01-03 NOTE — Telephone Encounter (Signed)
-----   Message from Carlena Bjornstad, MD sent at 01/03/2015  9:04 AM EDT ----- Please notify the patient that his chest x-ray does show some increased fluid in his lungs. The records show that he is on Lasix 80 in the morning and 40 later in the day. Please reverify this with him. If this is his proper dosing, please have him increase his Lasix to 80 twice a day. Please remind him to be careful with his salt and to cut back his overall fluid intake. Please have him elevate his feet at times during the day. If he is not weighing himself daily, please ask him to try to obtain a scale so that he can weigh himself daily. I do not have any recent labs on him. If he has recent labs with Dr. Sherrie Sport, please get these for me. If not, he needs CBC, CMet, and TSH. Also ask him how he feeling compared to when he saw me recently. He was having shortness of breath at that time. Is at the same or better or getting worse.

## 2015-01-03 NOTE — Telephone Encounter (Signed)
Patient informed and verbalized understanding of plan. Patient confirmed his furosemide dosage of 80 mg in the am and 40 mg in the pm. Patient understands to increase to 80 mg twice daily and to weigh himself daily. Also patient said he did labs with Hasanj's office every 3 months. Lab work will be requested. Patient advised nurse that his sob was better than it was at last office visit due to being on an antibiotic prescribed by Dr. Telford Nab.

## 2015-01-11 ENCOUNTER — Encounter (INDEPENDENT_AMBULATORY_CARE_PROVIDER_SITE_OTHER): Payer: Medicare Other

## 2015-01-11 ENCOUNTER — Other Ambulatory Visit (INDEPENDENT_AMBULATORY_CARE_PROVIDER_SITE_OTHER): Payer: Medicare Other

## 2015-01-11 ENCOUNTER — Other Ambulatory Visit: Payer: Self-pay

## 2015-01-11 DIAGNOSIS — I2510 Atherosclerotic heart disease of native coronary artery without angina pectoris: Secondary | ICD-10-CM | POA: Diagnosis not present

## 2015-01-11 DIAGNOSIS — R0602 Shortness of breath: Secondary | ICD-10-CM | POA: Diagnosis not present

## 2015-01-11 DIAGNOSIS — I739 Peripheral vascular disease, unspecified: Secondary | ICD-10-CM | POA: Diagnosis not present

## 2015-01-12 ENCOUNTER — Telehealth: Payer: Self-pay | Admitting: *Deleted

## 2015-01-12 DIAGNOSIS — Z7901 Long term (current) use of anticoagulants: Secondary | ICD-10-CM | POA: Diagnosis not present

## 2015-01-12 NOTE — Telephone Encounter (Signed)
Patient informed. 

## 2015-01-12 NOTE — Telephone Encounter (Signed)
-----   Message from Reginald Bjornstad, MD sent at 01/12/2015  9:46 AM EDT ----- Please notify the patient that his echo shows that the muscle function of his heart is still good. Please ask him how he is doing on the recent higher dose of Lasix.

## 2015-01-16 ENCOUNTER — Encounter: Payer: Self-pay | Admitting: Cardiology

## 2015-01-16 DIAGNOSIS — I5032 Chronic diastolic (congestive) heart failure: Secondary | ICD-10-CM | POA: Insufficient documentation

## 2015-01-18 ENCOUNTER — Encounter: Payer: Self-pay | Admitting: Cardiology

## 2015-01-18 ENCOUNTER — Ambulatory Visit (INDEPENDENT_AMBULATORY_CARE_PROVIDER_SITE_OTHER): Payer: Medicare Other | Admitting: Cardiology

## 2015-01-18 VITALS — BP 110/62 | HR 64 | Ht 72.0 in | Wt 205.0 lb

## 2015-01-18 DIAGNOSIS — I5032 Chronic diastolic (congestive) heart failure: Secondary | ICD-10-CM | POA: Diagnosis not present

## 2015-01-18 DIAGNOSIS — M79604 Pain in right leg: Secondary | ICD-10-CM | POA: Diagnosis not present

## 2015-01-18 DIAGNOSIS — I2510 Atherosclerotic heart disease of native coronary artery without angina pectoris: Secondary | ICD-10-CM

## 2015-01-18 DIAGNOSIS — M79605 Pain in left leg: Secondary | ICD-10-CM | POA: Insufficient documentation

## 2015-01-18 NOTE — Progress Notes (Signed)
Cardiology Office Note   Date:  01/18/2015   ID:  SAYGE SALVATO, DOB [birth year], MRN [id number]  PCP:  Neale Burly, MD  Cardiologist:  Dola Argyle, MD   Chief Complaint  Patient presents with  . Appointment    Follow-up shortness of breath      History of Present Illness: ARYN KOPS is a 81 y.o. male who presents to follow up shortness of breath, fluid overload, and bilateral leg pain. I saw the patient last December 29, 2014. He appeared volume overloaded. Chest x-ray confirmed this with significant fluid including pleural effusions. He is responded with a 16 pound diuresis from Lasix. 2-D echo shows that he continues to have good systolic left ventricular function. Therefore his volume overload is related to diastolic dysfunction. In addition leg arterial Dopplers were done. He actually had improvement in his ABIs since 2014. His venous system was not studied.  He's feeling better from the viewpoint of his breathing and his volume status. He continues to have significant bilateral leg discomfort. I suspect that this is musculoskeletal, possibly related to his lumbar spine. In addition he has some upper back and neck pain. This may be from cervical or thoracic spine disease.    Past Medical History  Diagnosis Date  . GERD (gastroesophageal reflux disease)   . CAD (coronary artery disease)     catheterization 2002,total LAD...single-vessel disease .Marland KitchenMarland KitchenEF 55-60% echo May,2008(EF had been 35% previously)...stress echo 2009...no definite ischemia..question of increased LVOT velocity of 96m/s not recorded MPNT,61-44% LICA  . COPD (chronic obstructive pulmonary disease)   . Gouty arthritis   . Spinal stenosis   . Tobacco abuse   . Dyslipidemia   . Renal insufficiency   . Diabetes mellitus   . Gynecomastia     chronic  . Edema of lower extremity     chronic  . Asthma   . Carotid artery disease     Doppler, November, 2011, mild plaque, less than 50% bilateral  . Ejection  fraction     Previously 35%, /  EF 55-60%,Echo, 2008  /  stress echo 2009 question increased LVOT velocity 3 m/s but not well recorded  . Atrial fibrillation     2010, Coumadin therapy  . Warfarin anticoagulation     Atrial fibrillation  . Varicose veins   . Leg pain, bilateral   . Leg swelling     Past Surgical History  Procedure Laterality Date  . Cholecystectomy    . Eye surgery    . Hernia repair    . Endovenous ablation saphenous vein w/ laser Left 03-03-2013    endovenous laser ablation left greater saphenous vein by Curt Jews 03-03-2013  . Endovenous ablation saphenous vein w/ laser Right 03-24-2013    right greater saphenous vein  by Curt Jews MD    Patient Active Problem List   Diagnosis Date Noted  . Chronic diastolic CHF (congestive heart failure) 01/16/2015  . Claudication 12/29/2014  . Upper respiratory infection 12/29/2014  . Shortness of breath 12/29/2014  . Pain in limb 02/22/2013  . Varicose veins of lower extremities with other complications 31/54/0086  . PVD (peripheral vascular disease) 05/18/2012  . CAD (coronary artery disease)   . Ejection fraction   . Edema of lower extremity   . Dyslipidemia   . Tobacco abuse   . COPD (chronic obstructive pulmonary disease)   . Renal insufficiency   . Asthma   . Carotid artery disease   . Warfarin anticoagulation   .  Atrial fibrillation   . DM 08/07/2010  . GERD 08/07/2010  . GYNECOMASTIA 08/07/2010      Current Outpatient Prescriptions  Medication Sig Dispense Refill  . allopurinol (ZYLOPRIM) 100 MG tablet Take 100 mg by mouth daily.      . calcium-vitamin D (OSCAL WITH D) 500-200 MG-UNIT per tablet Take 1 tablet by mouth 2 (two) times daily.      . Colesevelam HCl (WELCHOL) 3.75 G PACK Take 1 each by mouth daily.      . colestipol (COLESTID) 1 G tablet Take 1 g by mouth 2 (two) times daily.      . fish oil-omega-3 fatty acids 1000 MG capsule Take 1 g by mouth 3 (three) times daily.      . furosemide  (LASIX) 40 MG tablet Take 2 tablets (80 mg total) by mouth 2 (two) times daily. Dose increase 01/03/15 120 tablet 2  . glimepiride (AMARYL) 4 MG tablet Take 4 mg by mouth daily with breakfast.    . isosorbide mononitrate (IMDUR) 60 MG 24 hr tablet Take 60 mg by mouth daily.      Marland Kitchen lisinopril (PRINIVIL,ZESTRIL) 10 MG tablet Take 1 tablet (10 mg total) by mouth daily. 30 tablet 6  . metFORMIN (GLUCOPHAGE) 500 MG tablet Take 500 mg by mouth 2 (two) times daily with a meal.    . metoprolol (LOPRESSOR) 100 MG tablet TAKE 1 TABLET BY MOUTH TWICE DAILY 60 tablet 6  . Multiple Vitamin (MULTIVITAMIN) tablet Take 1 tablet by mouth daily.      . Multiple Vitamins-Minerals (VISION FORMULA/LUTEIN) TABS Take 1 tablet by mouth daily.    . potassium chloride SA (K-DUR,KLOR-CON) 20 MEQ tablet Take 20 mEq by mouth daily.    . ranitidine (ZANTAC) 150 MG tablet Take 150 mg by mouth 2 (two) times daily.      . traMADol (ULTRAM) 50 MG tablet Take 50 mg by mouth 2 (two) times daily.    Marland Kitchen warfarin (COUMADIN) 5 MG tablet Take 5 mg by mouth as directed. Per Dr. Sherrie Sport office             Prior to laser ablation on 03-24-2013 last dose taken 03-18-2013.     No current facility-administered medications for this visit.    Allergies:   Morphine    Social History:  The patient  reports that he quit smoking about 6 years ago. His smoking use included Cigars. He has never used smokeless tobacco. He reports that he does not drink alcohol or use illicit drugs.   Family History:  The patient's family history includes Diabetes in his mother; Hypertension in his mother.    ROS:  Please see the history of present illness.    Patient denies fever, chills, headache, sweats, rash, change in vision, change in hearing, chest pain, cough, nausea or vomiting, urinary symptoms. All other systems are reviewed and are negative.    PHYSICAL EXAM: VS:  BP 110/62 mmHg  Pulse 64  Ht 6' (1.829 m)  Wt 205 lb (92.987 kg)  BMI 27.80 kg/m2   SpO2 96% , patient's walking with a cane. He is oriented to person time and place. Affect is normal. Head is atraumatic. Sclerae and conjunctiva are normal. There is no jugular venous distention. Lungs are clear. Respiratory effort is not labored. Cardiac exam reveals S1 and S2. The abdomen is soft. He is wearing support hose and his edema is gone.  EKG:   EKG is not done today.   Recent Labs:  No results found for requested labs within last 365 days.    Lipid Panel No results found for: CHOL, TRIG, HDL, CHOLHDL, VLDL, LDLCALC, LDLDIRECT    Wt Readings from Last 3 Encounters:  01/18/15 205 lb (92.987 kg)  12/29/14 216 lb 12.8 oz (98.34 kg)  03/13/14 217 lb 12.8 oz (98.793 kg)      Current medicines are reviewed  The patient understands his medications.     ASSESSMENT AND PLAN:

## 2015-01-18 NOTE — Patient Instructions (Signed)
Your physician recommends that you continue on your current medications as directed. Please refer to the Current Medication list given to you today.  Your physician recommends that you have lab work today to check your BMET.  Your physician recommends that you schedule a follow-up appointment in: 6 weeks.

## 2015-01-18 NOTE — Assessment & Plan Note (Signed)
The patient has diuresed 16 pounds since his last visit. Volume overload is due to chronic diastolic CHF. He will remain on the higher dose of Lasix. Chemistry will be checked today to be sure that his renal function and potassium are stable. His echo shows good systolic function.

## 2015-01-18 NOTE — Assessment & Plan Note (Signed)
His arterial Dopplers are stable. I think it is unlikely he has pain from his venous disease which has also been treated. He has low back pain. I suspect his leg pain may be related to musculoskeletal/neurologic abnormalities related to his spine. He also has back and neck pain which may also be related to spine disease. He will follow-up with his primary physician.

## 2015-01-24 ENCOUNTER — Encounter: Payer: Self-pay | Admitting: *Deleted

## 2015-01-24 DIAGNOSIS — M4800 Spinal stenosis, site unspecified: Secondary | ICD-10-CM | POA: Diagnosis not present

## 2015-01-24 DIAGNOSIS — E1165 Type 2 diabetes mellitus with hyperglycemia: Secondary | ICD-10-CM | POA: Diagnosis not present

## 2015-01-24 DIAGNOSIS — I10 Essential (primary) hypertension: Secondary | ICD-10-CM | POA: Diagnosis not present

## 2015-01-26 ENCOUNTER — Telehealth: Payer: Self-pay | Admitting: *Deleted

## 2015-01-26 NOTE — Telephone Encounter (Signed)
-----   Message from Carlena Bjornstad, MD sent at 01/21/2015  4:57 PM EDT ----- Please notify the patient that I have reviewed his lab. I'm not recommending any change in his medicines at this time. Please contact Dr. Joselyn Arrow office and try to obtain 2 Bmets that the patient has had on going backward and time. I have nothing to compare his recent lab to.

## 2015-01-26 NOTE — Telephone Encounter (Signed)
Patient informed and last 2 labs from Hasanj's office available for review. Copy sent to Hasanj per patient request.

## 2015-01-29 DIAGNOSIS — M2578 Osteophyte, vertebrae: Secondary | ICD-10-CM | POA: Diagnosis not present

## 2015-01-29 DIAGNOSIS — M4806 Spinal stenosis, lumbar region: Secondary | ICD-10-CM | POA: Diagnosis not present

## 2015-01-29 DIAGNOSIS — M4316 Spondylolisthesis, lumbar region: Secondary | ICD-10-CM | POA: Diagnosis not present

## 2015-01-29 DIAGNOSIS — M5136 Other intervertebral disc degeneration, lumbar region: Secondary | ICD-10-CM | POA: Diagnosis not present

## 2015-01-29 DIAGNOSIS — M810 Age-related osteoporosis without current pathological fracture: Secondary | ICD-10-CM | POA: Diagnosis not present

## 2015-01-29 DIAGNOSIS — M47814 Spondylosis without myelopathy or radiculopathy, thoracic region: Secondary | ICD-10-CM | POA: Diagnosis not present

## 2015-01-29 DIAGNOSIS — E119 Type 2 diabetes mellitus without complications: Secondary | ICD-10-CM | POA: Diagnosis not present

## 2015-01-31 DIAGNOSIS — Z7901 Long term (current) use of anticoagulants: Secondary | ICD-10-CM | POA: Diagnosis not present

## 2015-02-28 DIAGNOSIS — Z7901 Long term (current) use of anticoagulants: Secondary | ICD-10-CM | POA: Diagnosis not present

## 2015-03-14 ENCOUNTER — Ambulatory Visit (INDEPENDENT_AMBULATORY_CARE_PROVIDER_SITE_OTHER): Payer: Medicare Other | Admitting: Cardiology

## 2015-03-14 ENCOUNTER — Encounter: Payer: Self-pay | Admitting: Cardiology

## 2015-03-14 VITALS — BP 120/60 | HR 49 | Ht 72.0 in | Wt 207.0 lb

## 2015-03-14 DIAGNOSIS — I2510 Atherosclerotic heart disease of native coronary artery without angina pectoris: Secondary | ICD-10-CM | POA: Diagnosis not present

## 2015-03-14 DIAGNOSIS — I4892 Unspecified atrial flutter: Secondary | ICD-10-CM | POA: Insufficient documentation

## 2015-03-14 DIAGNOSIS — N289 Disorder of kidney and ureter, unspecified: Secondary | ICD-10-CM

## 2015-03-14 DIAGNOSIS — I483 Typical atrial flutter: Secondary | ICD-10-CM

## 2015-03-14 DIAGNOSIS — I482 Chronic atrial fibrillation, unspecified: Secondary | ICD-10-CM

## 2015-03-14 DIAGNOSIS — I5032 Chronic diastolic (congestive) heart failure: Secondary | ICD-10-CM

## 2015-03-14 NOTE — Patient Instructions (Signed)
Your physician recommends that you continue on your current medications as directed. Please refer to the Current Medication list given to you today. Your physician recommends that you return for lab work today to check your BMET. Your physician recommends that you schedule a follow-up appointment in: 06/13/15 with Dr. Ron Parker.

## 2015-03-14 NOTE — Progress Notes (Signed)
Cardiology Office Note   Date:  03/14/2015   ID:  Reginald [name], DOB 04-Sep-1930, MRN [id number]  PCP:  Neale Burly, MD  Cardiologist:  Dola Argyle, MD   Chief Complaint  Patient presents with  . Appointment    Follow-up CHF      History of Present Illness: Reginald [name] is a 81 y.o. male who presents today to follow up chronic diastolic CHF and leg pain. He had diuresis will sign last. His weight is holding steady. He says he feels much better. He still has low back pain.  Patient is aware that I will retire at the end of September, 2016. He had seen Dr. Domenic Polite many years ago. He would like to plan to follow-up with Dr. Domenic Polite    Past Medical History  Diagnosis Date  . GERD (gastroesophageal reflux disease)   . CAD (coronary artery disease)     catheterization 2002,total LAD...single-vessel disease .Marland KitchenMarland KitchenEF 55-60% echo May,2008(EF had been 35% previously)...stress echo 2009...no definite ischemia..question of increased LVOT velocity of 59m/s not recorded QJJH,41-74% LICA  . COPD (chronic obstructive pulmonary disease)   . Gouty arthritis   . Spinal stenosis   . Tobacco abuse   . Dyslipidemia   . Renal insufficiency   . Diabetes mellitus   . Gynecomastia     chronic  . Edema of lower extremity     chronic  . Asthma   . Carotid artery disease     Doppler, November, 2011, mild plaque, less than 50% bilateral  . Ejection fraction     Previously 35%, /  EF 55-60%,Echo, 2008  /  stress echo 2009 question increased LVOT velocity 3 m/s but not well recorded  . Atrial fibrillation     2010, Coumadin therapy  . Warfarin anticoagulation     Atrial fibrillation  . Varicose veins   . Leg pain, bilateral   . Leg swelling     Past Surgical History  Procedure Laterality Date  . Cholecystectomy    . Eye surgery    . Hernia repair    . Endovenous ablation saphenous vein w/ laser Left 03-03-2013    endovenous laser ablation left greater saphenous vein by Curt Jews  03-03-2013  . Endovenous ablation saphenous vein w/ laser Right 03-24-2013    right greater saphenous vein  by Curt Jews MD    Patient Active Problem List   Diagnosis Date Noted  . Leg pain, bilateral 01/18/2015  . Chronic diastolic CHF (congestive heart failure) 01/16/2015  . Claudication 12/29/2014  . Upper respiratory infection 12/29/2014  . Shortness of breath 12/29/2014  . Pain in limb 02/22/2013  . Varicose veins of lower extremities with other complications 05/12/4817  . PVD (peripheral vascular disease) 05/18/2012  . CAD (coronary artery disease)   . Ejection fraction   . Edema of lower extremity   . Dyslipidemia   . Tobacco abuse   . COPD (chronic obstructive pulmonary disease)   . Renal insufficiency   . Asthma   . Carotid artery disease   . Warfarin anticoagulation   . Atrial fibrillation   . DM 08/07/2010  . GERD 08/07/2010  . GYNECOMASTIA 08/07/2010      Current Outpatient Prescriptions  Medication Sig Dispense Refill  . allopurinol (ZYLOPRIM) 100 MG tablet Take 100 mg by mouth daily.      . calcium-vitamin D (OSCAL WITH D) 500-200 MG-UNIT per tablet Take 1 tablet by mouth 2 (two) times daily.      Marland Kitchen  Colesevelam HCl [facility]) 3.75 G PACK Take 1 each by mouth daily.      . colestipol (COLESTID) 1 G tablet Take 1 g by mouth 2 (two) times daily.      . fish oil-omega-3 fatty acids 1000 MG capsule Take 1 g by mouth 3 (three) times daily.      . furosemide (LASIX) 40 MG tablet Take 2 tablets (80 mg total) by mouth 2 (two) times daily. Dose increase 01/03/15 120 tablet 2  . glimepiride (AMARYL) 4 MG tablet Take 4 mg by mouth daily with breakfast.    . isosorbide mononitrate (IMDUR) 60 MG 24 hr tablet Take 60 mg by mouth daily.      Marland Kitchen lisinopril (PRINIVIL,ZESTRIL) 10 MG tablet Take 1 tablet (10 mg total) by mouth daily. 30 tablet 6  . metFORMIN (GLUCOPHAGE) 500 MG tablet Take 500 mg by mouth 2 (two) times daily with a meal.    . metoprolol (LOPRESSOR) 100 MG tablet  TAKE 1 TABLET BY MOUTH TWICE DAILY 60 tablet 6  . Multiple Vitamin (MULTIVITAMIN) tablet Take 1 tablet by mouth daily.      . Multiple Vitamins-Minerals (VISION FORMULA/LUTEIN) TABS Take 1 tablet by mouth daily.    . potassium chloride SA (K-DUR,KLOR-CON) 20 MEQ tablet Take 20 mEq by mouth daily.    . ranitidine (ZANTAC) 150 MG tablet Take 150 mg by mouth 2 (two) times daily.      . traMADol (ULTRAM) 50 MG tablet Take 50 mg by mouth 2 (two) times daily.    Marland Kitchen warfarin (COUMADIN) 5 MG tablet Take 5 mg by mouth as directed. Per Dr. Sherrie Sport office             Prior to laser ablation on 03-24-2013 last dose taken 03-18-2013.     No current facility-administered medications for this visit.    Allergies:   Morphine    Social History:  The patient  reports that he quit smoking about 6 years ago. His smoking use included Cigars. He has never used smokeless tobacco. He reports that he does not drink alcohol or use illicit drugs.   Family History:  The patient's family history includes Diabetes in his mother; Hypertension in his mother.    ROS:  Please see the history of present illness.   Patient denies fever, chills, headache, sweats, rash, change in vision, change in hearing, chest pain, cough, nausea or vomiting, urinary symptoms. All other systems are reviewed and are negative.     PHYSICAL EXAM: VS:  BP 120/60 mmHg  Pulse 49  Ht 6' (1.829 m)  Wt 207 lb (93.895 kg)  BMI 28.07 kg/m2  SpO2 97% , Patient is stable today. Head is atraumatic. Sclerae and conjunctivae are normal. There is no jugular venous distention. Lungs reveal decreased breath sounds. Cardiac exam reveals S1 and S2. There is no respiratory distress. The abdomen is soft. He is wearing support hose.  EKG:   EKG is done today and reviewed by me. Today it appears that he has atrial flutter. He has had atrial fibrillation in the past and is anticoagulated. His EKG last year showed sinus rhythm. The rate is controlled.   Recent  Labs: No results found for requested labs within last 365 days.    Lipid Panel No results found for: CHOL, TRIG, HDL, CHOLHDL, VLDL, LDLCALC, LDLDIRECT    Wt Readings from Last 3 Encounters:  03/14/15 207 lb (93.895 kg)  01/18/15 205 lb (92.987 kg)  12/29/14 216 lb 12.8 oz (  98.34 kg)      Current medicines are reviewed  The patient understands his medications.     ASSESSMENT AND PLAN:

## 2015-03-14 NOTE — Assessment & Plan Note (Signed)
Coronary disease is stable. No change in therapy. 

## 2015-03-14 NOTE — Assessment & Plan Note (Signed)
His volume status is stable. He is able to maintain his volume despite his atrial flutter. Follow-up chemistry will be checked. His R Clay his creatinine was in the range of 1.3. Recent lab on January 18, 2015 revealed creatinine of 1.49. BUN was 45. Repeat chemistry lab will be done today.

## 2015-03-14 NOTE — Assessment & Plan Note (Addendum)
Today his rhythm is atrial flutter. The rate is controlled. He is anticoagulated. He is asymptomatic with this. No further workup at this time. I will repeat his EKG when he returns for his next visit.

## 2015-03-15 ENCOUNTER — Telehealth: Payer: Self-pay | Admitting: *Deleted

## 2015-03-15 NOTE — Telephone Encounter (Signed)
-----   Message from Carlena Bjornstad, MD sent at 03/15/2015  4:36 PM EDT ----- Please notify the patient that his lab looks stable.

## 2015-03-23 ENCOUNTER — Other Ambulatory Visit: Payer: Self-pay

## 2015-03-23 ENCOUNTER — Other Ambulatory Visit: Payer: Self-pay | Admitting: Cardiology

## 2015-03-23 MED ORDER — FUROSEMIDE 40 MG PO TABS: 80.0000 mg | ORAL_TABLET | Freq: Two times a day (BID) | ORAL | Status: DC

## 2015-03-28 DIAGNOSIS — I482 Chronic atrial fibrillation: Secondary | ICD-10-CM | POA: Diagnosis not present

## 2015-03-30 ENCOUNTER — Encounter: Payer: Self-pay | Admitting: *Deleted

## 2015-03-30 NOTE — Telephone Encounter (Signed)
Letter mailed with results. 

## 2015-04-18 DIAGNOSIS — Z7901 Long term (current) use of anticoagulants: Secondary | ICD-10-CM | POA: Diagnosis not present

## 2015-04-20 ENCOUNTER — Other Ambulatory Visit: Payer: Self-pay | Admitting: Cardiology

## 2015-05-07 DIAGNOSIS — I10 Essential (primary) hypertension: Secondary | ICD-10-CM | POA: Diagnosis not present

## 2015-05-07 DIAGNOSIS — Z7901 Long term (current) use of anticoagulants: Secondary | ICD-10-CM | POA: Diagnosis not present

## 2015-05-07 DIAGNOSIS — E1140 Type 2 diabetes mellitus with diabetic neuropathy, unspecified: Secondary | ICD-10-CM | POA: Diagnosis not present

## 2015-05-09 DIAGNOSIS — H3531 Nonexudative age-related macular degeneration: Secondary | ICD-10-CM | POA: Diagnosis not present

## 2015-05-25 DIAGNOSIS — I868 Varicose veins of other specified sites: Secondary | ICD-10-CM

## 2015-05-30 DIAGNOSIS — Z7901 Long term (current) use of anticoagulants: Secondary | ICD-10-CM | POA: Diagnosis not present

## 2015-06-12 ENCOUNTER — Encounter (INDEPENDENT_AMBULATORY_CARE_PROVIDER_SITE_OTHER): Payer: Medicare Other | Admitting: Ophthalmology

## 2015-06-12 DIAGNOSIS — E11319 Type 2 diabetes mellitus with unspecified diabetic retinopathy without macular edema: Secondary | ICD-10-CM

## 2015-06-12 DIAGNOSIS — H3531 Nonexudative age-related macular degeneration: Secondary | ICD-10-CM

## 2015-06-12 DIAGNOSIS — E11329 Type 2 diabetes mellitus with mild nonproliferative diabetic retinopathy without macular edema: Secondary | ICD-10-CM | POA: Diagnosis not present

## 2015-06-12 DIAGNOSIS — I10 Essential (primary) hypertension: Secondary | ICD-10-CM | POA: Diagnosis not present

## 2015-06-12 DIAGNOSIS — H35033 Hypertensive retinopathy, bilateral: Secondary | ICD-10-CM | POA: Diagnosis not present

## 2015-06-12 DIAGNOSIS — H43813 Vitreous degeneration, bilateral: Secondary | ICD-10-CM

## 2015-06-13 ENCOUNTER — Encounter: Payer: Self-pay | Admitting: Cardiology

## 2015-06-13 ENCOUNTER — Ambulatory Visit (INDEPENDENT_AMBULATORY_CARE_PROVIDER_SITE_OTHER): Payer: Medicare Other | Admitting: Cardiology

## 2015-06-13 VITALS — BP 116/64 | HR 59 | Ht 72.0 in | Wt 206.0 lb

## 2015-06-13 DIAGNOSIS — Z7901 Long term (current) use of anticoagulants: Secondary | ICD-10-CM | POA: Diagnosis not present

## 2015-06-13 DIAGNOSIS — N289 Disorder of kidney and ureter, unspecified: Secondary | ICD-10-CM

## 2015-06-13 DIAGNOSIS — I4892 Unspecified atrial flutter: Secondary | ICD-10-CM | POA: Diagnosis not present

## 2015-06-13 DIAGNOSIS — I5032 Chronic diastolic (congestive) heart failure: Secondary | ICD-10-CM | POA: Diagnosis not present

## 2015-06-13 DIAGNOSIS — I2510 Atherosclerotic heart disease of native coronary artery without angina pectoris: Secondary | ICD-10-CM | POA: Diagnosis not present

## 2015-06-13 NOTE — Assessment & Plan Note (Signed)
Catheterization in 2002 revealed a total LAD with single-vessel disease. Stress echo in 2009 revealed no definite ischemia. No further workup.

## 2015-06-13 NOTE — Progress Notes (Signed)
Cardiology Office Note   Date:  06/13/2015   ID:  Reginald [name], DOB 02-03-1930, MRN [id number]  PCP:  Neale Burly, MD  Cardiologist:  Dola Argyle, MD   Chief Complaint  Patient presents with  . Appointment    Follow-up chronic diastolic CHF      History of Present Illness: Reginald [name] is a 81 y.o. male who presents today to follow-up diastolic CHF. He is actually doing quite well now. He wears thigh high support hose. This helps his venous disease. He is very careful with his salt and fluid intake. He is on high-dose diuretics. His last chemistry labs were stable with a creatinine of 1.5 and a BUN of 48. He is very appreciative of all of the health care that he is received over time. He knows Dr. Domenic Polite from the past and will follow with him in the future.    Past Medical History  Diagnosis Date  . GERD (gastroesophageal reflux disease)   . CAD (coronary artery disease)     catheterization 2002,total LAD...single-vessel disease .Marland KitchenMarland KitchenEF 55-60% echo May,2008(EF had been 35% previously)...stress echo 2009...no definite ischemia..question of increased LVOT velocity of 29m/s not recorded MPNT,61-44% LICA  . COPD (chronic obstructive pulmonary disease)   . Gouty arthritis   . Spinal stenosis   . Tobacco abuse   . Dyslipidemia   . Renal insufficiency   . Diabetes mellitus   . Gynecomastia     chronic  . Edema of lower extremity     chronic  . Asthma   . Carotid artery disease     Doppler, November, 2011, mild plaque, less than 50% bilateral  . Ejection fraction     Previously 35%, /  EF 55-60%,Echo, 2008  /  stress echo 2009 question increased LVOT velocity 3 m/s but not well recorded  . Atrial fibrillation     2010, Coumadin therapy  . Warfarin anticoagulation     Atrial fibrillation  . Varicose veins   . Leg pain, bilateral   . Leg swelling     Past Surgical History  Procedure Laterality Date  . Cholecystectomy    . Eye surgery    . Hernia repair    .  Endovenous ablation saphenous vein w/ laser Left 03-03-2013    endovenous laser ablation left greater saphenous vein by Curt Jews 03-03-2013  . Endovenous ablation saphenous vein w/ laser Right 03-24-2013    right greater saphenous vein  by Curt Jews MD    Patient Active Problem List   Diagnosis Date Noted  . Atrial flutter 03/14/2015  . Leg pain, bilateral 01/18/2015  . Chronic diastolic CHF (congestive heart failure) 01/16/2015  . Claudication 12/29/2014  . Upper respiratory infection 12/29/2014  . Shortness of breath 12/29/2014  . Pain in limb 02/22/2013  . Varicose veins of lower extremities with other complications 31/54/0086  . PVD (peripheral vascular disease) 05/18/2012  . CAD (coronary artery disease)   . Ejection fraction   . Edema of lower extremity   . Dyslipidemia   . Tobacco abuse   . COPD (chronic obstructive pulmonary disease)   . Renal insufficiency   . Asthma   . Carotid artery disease   . Warfarin anticoagulation   . Atrial fibrillation   . DM 08/07/2010  . GERD 08/07/2010  . GYNECOMASTIA 08/07/2010      Current Outpatient Prescriptions  Medication Sig Dispense Refill  . allopurinol (ZYLOPRIM) 100 MG tablet Take 100 mg by mouth daily.      Marland Kitchen  calcium-vitamin D (OSCAL WITH D) 500-200 MG-UNIT per tablet Take 1 tablet by mouth 2 (two) times daily.      . Colesevelam HCl (WELCHOL) 3.75 G PACK Take 1 each by mouth daily.      . colestipol (COLESTID) 1 G tablet Take 1 g by mouth 2 (two) times daily.      . fish oil-omega-3 fatty acids 1000 MG capsule Take 1 g by mouth 3 (three) times daily.      . furosemide (LASIX) 40 MG tablet Take 2 tablets (80 mg total) by mouth 2 (two) times daily. Dose increase 01/03/15 120 tablet 2  . glimepiride (AMARYL) 4 MG tablet Take 4 mg by mouth daily with breakfast.    . isosorbide mononitrate (IMDUR) 60 MG 24 hr tablet Take 60 mg by mouth daily.      Marland Kitchen lisinopril (PRINIVIL,ZESTRIL) 10 MG tablet Take 1 tablet (10 mg total) by  mouth daily. 30 tablet 6  . metFORMIN (GLUCOPHAGE) 500 MG tablet Take 500 mg by mouth 2 (two) times daily with a meal.    . metoprolol (LOPRESSOR) 100 MG tablet TAKE 1 TABLET BY MOUTH TWICE DAILY 60 tablet 1  . Multiple Vitamin (MULTIVITAMIN) tablet Take 1 tablet by mouth daily.      . Multiple Vitamins-Minerals (VISION FORMULA/LUTEIN) TABS Take 1 tablet by mouth daily.    . potassium chloride SA (K-DUR,KLOR-CON) 20 MEQ tablet Take 20 mEq by mouth daily.    . ranitidine (ZANTAC) 150 MG tablet Take 150 mg by mouth 2 (two) times daily.      . traMADol (ULTRAM) 50 MG tablet Take 50 mg by mouth 2 (two) times daily.    Marland Kitchen warfarin (COUMADIN) 5 MG tablet Take 5 mg by mouth as directed. Per Dr. Sherrie Sport office             Prior to laser ablation on 03-24-2013 last dose taken 03-18-2013.     No current facility-administered medications for this visit.    Allergies:   Morphine    Social History:  The patient  reports that he quit smoking about 6 years ago. His smoking use included Cigars. He has never used smokeless tobacco. He reports that he does not drink alcohol or use illicit drugs.   Family History:  The patient's family history includes Diabetes in his mother; Hypertension in his mother.    ROS:  Please see the history of present illness.  Patient denies fever, chills, headache, sweats, rash, change in vision, change in hearing, chest pain, cough, nausea or vomiting, urinary symptoms. All other systems are reviewed and are negative.      PHYSICAL EXAM: VS:  BP 116/64 mmHg  Pulse 59  Ht 6' (1.829 m)  Wt 206 lb (93.441 kg)  BMI 27.93 kg/m2  SpO2 97% , Patient is stable today. He is oriented to person time and place. Affect is normal. Head is atraumatic. Sclera and conjunctiva are normal. There is no jugulovenous distention. Lungs are clear. Respiratory effort is not labored. Cardiac exam reveals S1 and S2. The abdomen is soft. He is wearing thigh-high support hose. There is no significant  edema. There are no skin rashes.  EKG:   EKG is not done today.   Recent Labs: No results found for requested labs within last 365 days.    Lipid Panel No results found for: CHOL, TRIG, HDL, CHOLHDL, VLDL, LDLCALC, LDLDIRECT    Wt Readings from Last 3 Encounters:  06/13/15 206 lb (93.441 kg)  03/14/15  207 lb (93.895 kg)  01/18/15 205 lb (92.987 kg)      Current medicines are reviewed  The patient understands his medications.     ASSESSMENT AND PLAN:

## 2015-06-13 NOTE — Assessment & Plan Note (Signed)
He has renal insufficiency that has been stable. No change in therapy.

## 2015-06-13 NOTE — Assessment & Plan Note (Signed)
The patient's most recent rhythm by EKG with atrial flutter with a controlled rate. He is asymptomatic with this. He is anticoagulated. No further workup.

## 2015-06-13 NOTE — Assessment & Plan Note (Signed)
Coumadin will be continued. No change in therapy.

## 2015-06-13 NOTE — Patient Instructions (Signed)
Your physician recommends that you continue on your current medications as directed. Please refer to the Current Medication list given to you today. Your physician recommends that you schedule a follow-up appointment in: 3-4 months. You will receive a reminder letter in the mail in about 1-2 months reminding you to call and schedule your appointment. If you don't receive this letter, please contact our office.

## 2015-06-13 NOTE — Assessment & Plan Note (Signed)
He is quite stable on his current regimen. No change in therapy.

## 2015-06-22 ENCOUNTER — Other Ambulatory Visit: Payer: Self-pay | Admitting: *Deleted

## 2015-06-22 MED ORDER — METOPROLOL TARTRATE 100 MG PO TABS
100.0000 mg | ORAL_TABLET | Freq: Two times a day (BID) | ORAL | Status: DC
Start: 2015-06-22 — End: 2016-02-19

## 2015-07-04 DIAGNOSIS — Z7901 Long term (current) use of anticoagulants: Secondary | ICD-10-CM | POA: Diagnosis not present

## 2015-07-23 ENCOUNTER — Other Ambulatory Visit: Payer: Self-pay | Admitting: Cardiology

## 2015-07-24 ENCOUNTER — Other Ambulatory Visit: Payer: Self-pay

## 2015-07-24 MED ORDER — FUROSEMIDE 40 MG PO TABS: 80.0000 mg | ORAL_TABLET | Freq: Two times a day (BID) | ORAL | Status: DC

## 2015-08-08 DIAGNOSIS — Z7901 Long term (current) use of anticoagulants: Secondary | ICD-10-CM | POA: Diagnosis not present

## 2015-08-20 DIAGNOSIS — M62830 Muscle spasm of back: Secondary | ICD-10-CM | POA: Diagnosis not present

## 2015-08-20 DIAGNOSIS — E1142 Type 2 diabetes mellitus with diabetic polyneuropathy: Secondary | ICD-10-CM | POA: Diagnosis not present

## 2015-08-20 DIAGNOSIS — I10 Essential (primary) hypertension: Secondary | ICD-10-CM | POA: Diagnosis not present

## 2015-08-20 DIAGNOSIS — E1143 Type 2 diabetes mellitus with diabetic autonomic (poly)neuropathy: Secondary | ICD-10-CM | POA: Diagnosis not present

## 2015-08-24 ENCOUNTER — Other Ambulatory Visit: Payer: Self-pay | Admitting: Cardiology

## 2015-09-11 DIAGNOSIS — Z23 Encounter for immunization: Secondary | ICD-10-CM | POA: Diagnosis not present

## 2015-09-12 DIAGNOSIS — Z7901 Long term (current) use of anticoagulants: Secondary | ICD-10-CM | POA: Diagnosis not present

## 2015-09-19 ENCOUNTER — Other Ambulatory Visit: Payer: Self-pay | Admitting: Cardiology

## 2015-10-17 DIAGNOSIS — Z7901 Long term (current) use of anticoagulants: Secondary | ICD-10-CM | POA: Diagnosis not present

## 2015-11-20 DIAGNOSIS — E1142 Type 2 diabetes mellitus with diabetic polyneuropathy: Secondary | ICD-10-CM | POA: Diagnosis not present

## 2015-11-20 DIAGNOSIS — Z7901 Long term (current) use of anticoagulants: Secondary | ICD-10-CM | POA: Diagnosis not present

## 2015-11-20 DIAGNOSIS — I10 Essential (primary) hypertension: Secondary | ICD-10-CM | POA: Diagnosis not present

## 2015-11-21 DIAGNOSIS — H04123 Dry eye syndrome of bilateral lacrimal glands: Secondary | ICD-10-CM | POA: Diagnosis not present

## 2015-12-25 DIAGNOSIS — H16143 Punctate keratitis, bilateral: Secondary | ICD-10-CM | POA: Diagnosis not present

## 2015-12-26 DIAGNOSIS — Z7901 Long term (current) use of anticoagulants: Secondary | ICD-10-CM | POA: Diagnosis not present

## 2016-01-23 DIAGNOSIS — Z7901 Long term (current) use of anticoagulants: Secondary | ICD-10-CM | POA: Diagnosis not present

## 2016-01-30 DIAGNOSIS — I872 Venous insufficiency (chronic) (peripheral): Secondary | ICD-10-CM

## 2016-02-15 ENCOUNTER — Ambulatory Visit (INDEPENDENT_AMBULATORY_CARE_PROVIDER_SITE_OTHER): Payer: Medicare Other | Admitting: Cardiology

## 2016-02-15 ENCOUNTER — Encounter: Payer: Self-pay | Admitting: Cardiology

## 2016-02-15 VITALS — BP 109/65 | HR 71 | Ht 72.0 in | Wt 187.4 lb

## 2016-02-15 DIAGNOSIS — I4891 Unspecified atrial fibrillation: Secondary | ICD-10-CM

## 2016-02-15 DIAGNOSIS — I482 Chronic atrial fibrillation, unspecified: Secondary | ICD-10-CM

## 2016-02-15 DIAGNOSIS — E785 Hyperlipidemia, unspecified: Secondary | ICD-10-CM | POA: Diagnosis not present

## 2016-02-15 DIAGNOSIS — N183 Chronic kidney disease, stage 3 (moderate): Secondary | ICD-10-CM

## 2016-02-15 DIAGNOSIS — I2510 Atherosclerotic heart disease of native coronary artery without angina pectoris: Secondary | ICD-10-CM | POA: Diagnosis not present

## 2016-02-15 NOTE — Patient Instructions (Signed)

## 2016-02-15 NOTE — Progress Notes (Signed)
Cardiology Office Note  Date: 02/15/2016   ID: Reginald [name], DOB 1930-09-23, MRN [id number]  PCP: Neale Burly, MD  Evaluating Cardiologist: Rozann Lesches, MD   Chief Complaint  Patient presents with  . Coronary Artery Disease  . Atrial Fibrillation    History of Present Illness: Reginald [name] is an 82 y.o. male former patient of Dr. Ron Parker now establishing follow-up with me. He was last seen by Dr. Ron Parker in September 2016. I reviewed extensive records and updated his chart. He reports generally decreased stamina over the years, no specific angina symptoms however. He is not aware of palpitations or heart rate irregularity with his atrial fibrillation. Does report significant arthritic pain and stiffness, also falls, he uses a cane. He reports no major bleeding problems or injuries.  He continues on Coumadin, followed by Dr. Sherrie Sport.  Most recent echocardiogram was in April 2016 as outlined below.  I reviewed his ECG today which shows atrial flutter with variable conduction and left anterior fascicular block.  Past Medical History  Diagnosis Date  . GERD (gastroesophageal reflux disease)   . CAD (coronary artery disease)     Occluded LAD 2002 with faint collaterals, mild to moderate residual disease otherwise - managed medically  . COPD (chronic obstructive pulmonary disease) ([place])   . Gouty arthritis   . Spinal stenosis   . Carotid artery disease ([place])   . Dyslipidemia   . Renal insufficiency   . Type 2 diabetes mellitus ([place])   . Gynecomastia   . Edema of lower extremity   . Asthma   . Atrial fibrillation ([place])     2010, Coumadin therapy  . Varicose veins   . Leg pain, bilateral     Past Surgical History  Procedure Laterality Date  . Cholecystectomy    . Eye surgery    . Hernia repair    . Endovenous ablation saphenous vein w/ laser Left 03-03-2013    Endovenous laser ablation left greater saphenous vein by Curt Jews 03-03-2013  . Endovenous ablation  saphenous vein w/ laser Right 03-24-2013    Right greater saphenous vein  by Curt Jews MD    Current Outpatient Prescriptions  Medication Sig Dispense Refill  . allopurinol (ZYLOPRIM) 100 MG tablet Take 100 mg by mouth daily.      . calcium-vitamin D (OSCAL WITH D) 500-200 MG-UNIT per tablet Take 1 tablet by mouth 2 (two) times daily.      . Colesevelam HCl (WELCHOL) 3.75 G PACK Take 1 each by mouth daily.      . colestipol (COLESTID) 1 G tablet Take 1 g by mouth 2 (two) times daily.      . fish oil-omega-3 fatty acids 1000 MG capsule Take 1 g by mouth 3 (three) times daily.      . furosemide (LASIX) 40 MG tablet Take 2 tablets (80 mg total) by mouth 2 (two) times daily. Dose increase 01/03/15 120 tablet 11  . glimepiride (AMARYL) 4 MG tablet Take 4 mg by mouth daily with breakfast.    . isosorbide mononitrate (IMDUR) 60 MG 24 hr tablet Take 60 mg by mouth daily.      Marland Kitchen lisinopril (PRINIVIL,ZESTRIL) 10 MG tablet TAKE 1 TABLET BY MOUTH EVERY DAY 30 tablet 9  . metFORMIN (GLUCOPHAGE) 500 MG tablet Take 500 mg by mouth 2 (two) times daily with a meal.    . metoprolol (LOPRESSOR) 100 MG tablet Take 1 tablet (100 mg total) by mouth 2 (two) times  daily. 60 tablet 6  . Multiple Vitamin (MULTIVITAMIN) tablet Take 1 tablet by mouth daily.      . Multiple Vitamins-Minerals (VISION FORMULA/LUTEIN) TABS Take 1 tablet by mouth daily.    . potassium chloride SA (K-DUR,KLOR-CON) 20 MEQ tablet Take 20 mEq by mouth daily.    . ranitidine (ZANTAC) 150 MG tablet Take 150 mg by mouth 2 (two) times daily.      . traMADol (ULTRAM) 50 MG tablet Take 50 mg by mouth 2 (two) times daily.    Marland Kitchen warfarin (COUMADIN) 5 MG tablet Take 5 mg by mouth as directed. Per Dr. Sherrie Sport office             Prior to laser ablation on 03-24-2013 last dose taken 03-18-2013.     No current facility-administered medications for this visit.   Allergies:  Morphine   Social History: The patient  reports that he quit smoking about 7 years  ago. His smoking use included Cigars. He has never used smokeless tobacco. He reports that he does not drink alcohol or use illicit drugs.   Family History: The patient's family history includes Diabetes in his mother; Hypertension in his mother.   ROS:  Please see the history of present illness. Otherwise, complete review of systems is positive for arthritic pain and stiffness.  All other systems are reviewed and negative.   Physical Exam: VS:  BP 109/65 mmHg  Pulse 71  Ht 6' (1.829 m)  Wt 187 lb 6.4 oz (85.004 kg)  BMI 25.41 kg/m2  SpO2 97%, BMI Body mass index is 25.41 kg/(m^2).  Wt Readings from Last 3 Encounters:  02/15/16 187 lb 6.4 oz (85.004 kg)  06/13/15 206 lb (93.441 kg)  03/14/15 207 lb (93.895 kg)    General: Elderly male, no distress. Using a cane to walk. HEENT: Conjunctiva and lids normal, oropharynx clear. Neck: Supple, no elevated JVP or carotid bruits, no thyromegaly. Lungs: Diminished breath sounds without wheezing, nonlabored  at rest. Cardiac: Irregularly irregular, no S3, soft systolic murmur, no pericardial rub. Abdomen: Soft, nontender, bowel sounds present, no guarding or rebound. Extremities: Mild edema, distal pulses 2+. Skin: Warm and dry. Musculoskeletal: No kyphosis. Neuropsychiatric: Alert and oriented x3, affect grossly appropriate.  ECG: I personally reviewed the tracing from 03/14/2015 which showed atrial flutter with variable block and nonspecific T-wave changes.  Recent Labwork:  June 2016: BUN 48, creatinine 1.5, potassium 4.6  Other Studies Reviewed Today:  Cardiac catheterization 01/15/2001: Hemodynamics: LV 123/21, AO 119/54.  Coronaries: 1. The left main had luminal irregularities. 2. The LAD was occluded proximally with scant circumflex to apical LAD and  right coronary artery to apical LAD collaterals. 3. The circumflex primarily consisted of a large mid obtuse marginal. There  was proximal 25% stenosis. There was a small  branch off this mid obtuse  marginal with ostial 70% stenosis. 4. The right coronary artery was a large vessel with a long proximal 40%  stenosis. There was long 25% stenosis after the PDA. The PDA was a  medium sized vessel with mid 60% focal stenosis.  Left ventriculogram: The left ventriculogram was obtained in the RAO and LAO projections. The EF was 35% with severe anterior and anteroapical akinesis.  Echocardiogram 01/11/2015: Study Conclusions  - Left ventricle: Wall thickness was increased in a pattern of mild LVH. Systolic function was normal. The estimated ejection fraction was in the range of 50% to 55%. The study is not technically sufficient to allow evaluation of LV diastolic function. -  Aortic valve: Moderately calcified annulus. Moderately thickened leaflets. Valve area (VTI): 1.59 cm^2. Valve area (Vmean): 2.21 cm^2. - Mitral valve: Mildly calcified annulus. Mildly thickened leaflets. - Left atrium: The atrium was mildly dilated. - Right ventricle: The cavity size was mildly dilated. - Pulmonary arteries: PA peak pressure: 36 mm Hg (S). - Inferior vena cava: The vessel was dilated. The respirophasic diameter changes were blunted (< 50%), consistent with elevated central venous pressure. - Technically difficult study.  Assessment and Plan:  1. Chronic atrial fibrillation/flutter. He is symptomatically stable without palpitations. He is on Lopressor for heart rate control and continues on Coumadin with follow-up per Dr. Sherrie Sport.  2. CAD with known occlusion of the LAD and otherwise moderate residual disease is being managed medically. He has not undergone recent ischemic testing, however has been without any substantial anginal on medical therapy. Plan to continue observation for now on lisinopril, Imdur, and Lopressor.  3. Hyperlipidemia, on WelChol and omega-3 supplements, followed by Dr. Sherrie Sport.  4. CKD stage III, last creatinine  1.5.  Current medicines were reviewed with the patient today.   Orders Placed This Encounter  Procedures  . EKG 12-Lead    Disposition: FU with me in 1 year.   Signed, Satira Sark, MD, [facility] 02/15/2016 11:17 AM    [facility] [place], [place], [place] [address] Phone: [phone]; Fax: [phone]

## 2016-02-18 DIAGNOSIS — Z0000 Encounter for general adult medical examination without abnormal findings: Secondary | ICD-10-CM | POA: Diagnosis not present

## 2016-02-18 DIAGNOSIS — I10 Essential (primary) hypertension: Secondary | ICD-10-CM | POA: Diagnosis not present

## 2016-02-18 DIAGNOSIS — N183 Chronic kidney disease, stage 3 (moderate): Secondary | ICD-10-CM | POA: Diagnosis not present

## 2016-02-18 DIAGNOSIS — E1142 Type 2 diabetes mellitus with diabetic polyneuropathy: Secondary | ICD-10-CM | POA: Diagnosis not present

## 2016-02-18 DIAGNOSIS — I4891 Unspecified atrial fibrillation: Secondary | ICD-10-CM | POA: Diagnosis not present

## 2016-02-18 DIAGNOSIS — Z1389 Encounter for screening for other disorder: Secondary | ICD-10-CM | POA: Diagnosis not present

## 2016-02-19 ENCOUNTER — Other Ambulatory Visit: Payer: Self-pay | Admitting: Cardiology

## 2016-03-19 DIAGNOSIS — Z79899 Other long term (current) drug therapy: Secondary | ICD-10-CM | POA: Diagnosis not present

## 2016-03-19 DIAGNOSIS — Z7901 Long term (current) use of anticoagulants: Secondary | ICD-10-CM | POA: Diagnosis not present

## 2016-04-09 DIAGNOSIS — Z7901 Long term (current) use of anticoagulants: Secondary | ICD-10-CM | POA: Diagnosis not present

## 2016-05-07 DIAGNOSIS — Z7901 Long term (current) use of anticoagulants: Secondary | ICD-10-CM | POA: Diagnosis not present

## 2016-05-27 DIAGNOSIS — I481 Persistent atrial fibrillation: Secondary | ICD-10-CM | POA: Diagnosis not present

## 2016-05-27 DIAGNOSIS — I10 Essential (primary) hypertension: Secondary | ICD-10-CM | POA: Diagnosis not present

## 2016-05-27 DIAGNOSIS — N183 Chronic kidney disease, stage 3 (moderate): Secondary | ICD-10-CM | POA: Diagnosis not present

## 2016-05-27 DIAGNOSIS — E1142 Type 2 diabetes mellitus with diabetic polyneuropathy: Secondary | ICD-10-CM | POA: Diagnosis not present

## 2016-05-27 DIAGNOSIS — M1009 Idiopathic gout, multiple sites: Secondary | ICD-10-CM | POA: Diagnosis not present

## 2016-06-18 DIAGNOSIS — I481 Persistent atrial fibrillation: Secondary | ICD-10-CM | POA: Diagnosis not present

## 2016-06-27 DIAGNOSIS — I517 Cardiomegaly: Secondary | ICD-10-CM | POA: Diagnosis not present

## 2016-06-27 DIAGNOSIS — I481 Persistent atrial fibrillation: Secondary | ICD-10-CM | POA: Diagnosis not present

## 2016-06-27 DIAGNOSIS — I340 Nonrheumatic mitral (valve) insufficiency: Secondary | ICD-10-CM | POA: Diagnosis not present

## 2016-06-27 DIAGNOSIS — R931 Abnormal findings on diagnostic imaging of heart and coronary circulation: Secondary | ICD-10-CM | POA: Diagnosis not present

## 2016-06-27 DIAGNOSIS — I361 Nonrheumatic tricuspid (valve) insufficiency: Secondary | ICD-10-CM | POA: Diagnosis not present

## 2016-07-02 DIAGNOSIS — I481 Persistent atrial fibrillation: Secondary | ICD-10-CM | POA: Diagnosis not present

## 2016-07-08 ENCOUNTER — Ambulatory Visit (INDEPENDENT_AMBULATORY_CARE_PROVIDER_SITE_OTHER): Payer: Medicare Other | Admitting: Family

## 2016-07-09 DIAGNOSIS — H353110 Nonexudative age-related macular degeneration, right eye, stage unspecified: Secondary | ICD-10-CM | POA: Diagnosis not present

## 2016-07-16 DIAGNOSIS — I481 Persistent atrial fibrillation: Secondary | ICD-10-CM | POA: Diagnosis not present

## 2016-07-21 ENCOUNTER — Other Ambulatory Visit: Payer: Self-pay | Admitting: *Deleted

## 2016-07-21 MED ORDER — FUROSEMIDE 40 MG PO TABS: 80.0000 mg | ORAL_TABLET | Freq: Two times a day (BID) | ORAL | 6 refills | Status: DC

## 2016-07-21 MED ORDER — LISINOPRIL 10 MG PO TABS: 10.0000 mg | ORAL_TABLET | Freq: Every day | ORAL | 6 refills | Status: AC

## 2016-08-06 DIAGNOSIS — I481 Persistent atrial fibrillation: Secondary | ICD-10-CM | POA: Diagnosis not present

## 2016-08-07 DIAGNOSIS — R0602 Shortness of breath: Secondary | ICD-10-CM | POA: Diagnosis not present

## 2016-08-20 DIAGNOSIS — I481 Persistent atrial fibrillation: Secondary | ICD-10-CM | POA: Diagnosis not present

## 2016-08-25 ENCOUNTER — Other Ambulatory Visit: Payer: Self-pay | Admitting: Cardiology

## 2016-09-03 DIAGNOSIS — I481 Persistent atrial fibrillation: Secondary | ICD-10-CM | POA: Diagnosis not present

## 2016-09-15 DIAGNOSIS — E1142 Type 2 diabetes mellitus with diabetic polyneuropathy: Secondary | ICD-10-CM | POA: Diagnosis not present

## 2016-09-15 DIAGNOSIS — M1009 Idiopathic gout, multiple sites: Secondary | ICD-10-CM | POA: Diagnosis not present

## 2016-09-15 DIAGNOSIS — N183 Chronic kidney disease, stage 3 (moderate): Secondary | ICD-10-CM | POA: Diagnosis not present

## 2016-09-15 DIAGNOSIS — L570 Actinic keratosis: Secondary | ICD-10-CM | POA: Diagnosis not present

## 2016-09-15 DIAGNOSIS — I481 Persistent atrial fibrillation: Secondary | ICD-10-CM | POA: Diagnosis not present

## 2016-09-15 DIAGNOSIS — I10 Essential (primary) hypertension: Secondary | ICD-10-CM | POA: Diagnosis not present

## 2016-09-24 DIAGNOSIS — E1142 Type 2 diabetes mellitus with diabetic polyneuropathy: Secondary | ICD-10-CM | POA: Diagnosis not present

## 2016-09-24 DIAGNOSIS — I10 Essential (primary) hypertension: Secondary | ICD-10-CM | POA: Diagnosis not present

## 2016-09-24 DIAGNOSIS — I481 Persistent atrial fibrillation: Secondary | ICD-10-CM | POA: Diagnosis not present

## 2016-09-24 DIAGNOSIS — N183 Chronic kidney disease, stage 3 (moderate): Secondary | ICD-10-CM | POA: Diagnosis not present

## 2016-09-27 ENCOUNTER — Emergency Department (HOSPITAL_COMMUNITY): Payer: Medicare Other

## 2016-09-27 ENCOUNTER — Emergency Department (HOSPITAL_COMMUNITY)
Admission: EM | Admit: 2016-09-27 | Discharge: 2016-09-27 | Disposition: A | Discharge status: Home / Self Care | Payer: Medicare Other | Attending: Emergency Medicine | Admitting: Emergency Medicine

## 2016-09-27 ENCOUNTER — Encounter (HOSPITAL_COMMUNITY): Payer: Self-pay | Admitting: Emergency Medicine

## 2016-09-27 DIAGNOSIS — I2510 Atherosclerotic heart disease of native coronary artery without angina pectoris: Secondary | ICD-10-CM | POA: Diagnosis not present

## 2016-09-27 DIAGNOSIS — Z7984 Long term (current) use of oral hypoglycemic drugs: Secondary | ICD-10-CM | POA: Insufficient documentation

## 2016-09-27 DIAGNOSIS — Z7901 Long term (current) use of anticoagulants: Secondary | ICD-10-CM | POA: Diagnosis not present

## 2016-09-27 DIAGNOSIS — R0602 Shortness of breath: Secondary | ICD-10-CM | POA: Diagnosis not present

## 2016-09-27 DIAGNOSIS — I509 Heart failure, unspecified: Secondary | ICD-10-CM | POA: Diagnosis not present

## 2016-09-27 DIAGNOSIS — J45909 Unspecified asthma, uncomplicated: Secondary | ICD-10-CM | POA: Diagnosis not present

## 2016-09-27 DIAGNOSIS — Z79899 Other long term (current) drug therapy: Secondary | ICD-10-CM | POA: Insufficient documentation

## 2016-09-27 DIAGNOSIS — E119 Type 2 diabetes mellitus without complications: Secondary | ICD-10-CM | POA: Insufficient documentation

## 2016-09-27 DIAGNOSIS — Z87891 Personal history of nicotine dependence: Secondary | ICD-10-CM | POA: Diagnosis not present

## 2016-09-27 DIAGNOSIS — J449 Chronic obstructive pulmonary disease, unspecified: Secondary | ICD-10-CM | POA: Diagnosis not present

## 2016-09-27 LAB — BASIC METABOLIC PANEL
ANION GAP: 10 (ref 5–15)
BUN: 35 mg/dL — AB (ref 6–20)
CO2: 29 mmol/L (ref 22–32)
Calcium: 9.4 mg/dL (ref 8.9–10.3)
Chloride: 101 mmol/L (ref 101–111)
Creatinine, Ser: 1.2 mg/dL (ref 0.61–1.24)
GFR calc Af Amer: >60 mL/min (ref >60)
GFR calc non Af Amer: 54 mL/min — ABNORMAL LOW (ref >60)
GLUCOSE: 193 mg/dL — AB (ref 65–99)
POTASSIUM: 3.9 mmol/L (ref 3.5–5.1)
Sodium: 140 mmol/L (ref 135–145)

## 2016-09-27 LAB — CBC WITH DIFFERENTIAL/PLATELET
Basophils Absolute: 0 10*3/uL (ref 0.0–0.1)
Basophils Relative: 0 %
EOS PCT: 1 %
Eosinophils Absolute: 0 10*3/uL (ref 0.0–0.7)
HEMATOCRIT: 35.1 % — AB (ref 39.0–52.0)
Hemoglobin: 11.1 g/dL — ABNORMAL LOW (ref 13.0–17.0)
LYMPHS PCT: 18 %
Lymphs Abs: 0.9 10*3/uL (ref 0.7–4.0)
MCH: 31.8 pg (ref 26.0–34.0)
MCHC: 31.6 g/dL (ref 30.0–36.0)
MCV: 100.6 fL — AB (ref 78.0–100.0)
MONOS PCT: 7 %
Monocytes Absolute: 0.4 10*3/uL (ref 0.1–1.0)
NEUTROS ABS: 4 10*3/uL (ref 1.7–7.7)
Neutrophils Relative %: 74 %
Platelets: 179 10*3/uL (ref 150–400)
RBC: 3.49 MIL/uL — ABNORMAL LOW (ref 4.22–5.81)
RDW: 15 % (ref 11.5–15.5)
WBC: 5.3 10*3/uL (ref 4.0–10.5)

## 2016-09-27 LAB — I-STAT TROPONIN, ED
TROPONIN I, POC: 0.01 ng/mL (ref 0.00–0.08)
Troponin i, poc: 0 ng/mL (ref 0.00–0.08)

## 2016-09-27 LAB — PROTIME-INR
INR: 3.3
PROTHROMBIN TIME: 34.3 s — AB (ref 11.4–15.2)

## 2016-09-27 LAB — BRAIN NATRIURETIC PEPTIDE: B Natriuretic Peptide: 517 pg/mL — ABNORMAL HIGH (ref 0.0–100.0)

## 2016-09-27 MED ORDER — ALBUTEROL SULFATE HFA 108 (90 BASE) MCG/ACT IN AERS: 1.0000 | INHALATION_SPRAY | Freq: Four times a day (QID) | RESPIRATORY_TRACT | 0 refills | Status: AC | PRN

## 2016-09-27 MED ORDER — ALBUTEROL SULFATE HFA 108 (90 BASE) MCG/ACT IN AERS
2.0000 | INHALATION_SPRAY | Freq: Once | RESPIRATORY_TRACT | Status: AC
  Administered 2016-09-27: 2 via RESPIRATORY_TRACT
  Filled 2016-09-27: qty 6.7

## 2016-09-27 MED ORDER — IPRATROPIUM-ALBUTEROL 0.5-2.5 (3) MG/3ML IN SOLN
3.0000 mL | Freq: Once | RESPIRATORY_TRACT | Status: AC
  Administered 2016-09-27: 3 mL via RESPIRATORY_TRACT
  Filled 2016-09-27: qty 3

## 2016-09-27 MED ORDER — ALBUTEROL SULFATE (2.5 MG/3ML) 0.083% IN NEBU: 5.0000 mg | INHALATION_SOLUTION | Freq: Once | RESPIRATORY_TRACT | Status: DC

## 2016-09-27 NOTE — ED Provider Notes (Signed)
Emergency Department Provider Note   I have reviewed the triage vital signs and the nursing notes.  By signing my name below, I, Bea Graff, attest that this documentation has been prepared under the direction and in the presence of Margette Fast, MD. Electronically Signed: Bea Graff, ED Scribe. 09/27/16. 1:58 PM   HISTORY  Chief Complaint Shortness of Breath   HPI Reginald [name] is an 82 y.o. male with PMHx of asthma, COPD, atrial fibrillation, T2DM, HLD and CAD worsening SOB that began two days ago. He states the SOB started only with exertion but is now constant. He reports bilateral leg swelling but states that is normal at baseline and is not as bad as it usually is. He reports taking Lasix twice daily (2 q am, 2 q pm) for fluid retention that was increased to five tablets daily for a short while three months ago but states it has since been decreased to the regular four tablets daily. He has not done anything to treat his symptoms. He denies modifying factors. He denies CP, fever, chills, cough, congestion.    Past Medical History:  Diagnosis Date  . Asthma   . Atrial fibrillation ([place])    2010, Coumadin therapy  . CAD (coronary artery disease)    Occluded LAD 2002 with faint collaterals, mild to moderate residual disease otherwise - managed medically  . Carotid artery disease ([place])   . COPD (chronic obstructive pulmonary disease) ([place])   . Dyslipidemia   . Edema of lower extremity   . GERD (gastroesophageal reflux disease)   . Gouty arthritis   . Gynecomastia   . Leg pain, bilateral   . Renal insufficiency   . Spinal stenosis   . Type 2 diabetes mellitus ([place])   . Varicose veins     Patient Active Problem List   Diagnosis Date Noted  . Atrial flutter ([place]) 03/14/2015  . Leg pain, bilateral 01/18/2015  . Chronic diastolic CHF (congestive heart failure) ([place]) 01/16/2015  . Claudication ([place]) 12/29/2014  . Upper respiratory infection 12/29/2014  .  Shortness of breath 12/29/2014  . Pain in limb 02/22/2013  . Varicose veins of lower extremities with other complications [id number]  . PVD (peripheral vascular disease) ([place]) 05/18/2012  . CAD (coronary artery disease)   . Ejection fraction   . Edema of lower extremity   . Dyslipidemia   . Tobacco abuse   . COPD (chronic obstructive pulmonary disease) ([place])   . Renal insufficiency   . Asthma   . Carotid artery disease ([place])   . Warfarin anticoagulation   . Atrial fibrillation ([place])   . DM 08/07/2010  . GERD 08/07/2010  . GYNECOMASTIA 08/07/2010    Past Surgical History:  Procedure Laterality Date  . CHOLECYSTECTOMY    . ENDOVENOUS ABLATION SAPHENOUS VEIN W/ LASER Left 03-03-2013   Endovenous laser ablation left greater saphenous vein by Curt Jews 03-03-2013  . ENDOVENOUS ABLATION SAPHENOUS VEIN W/ LASER Right 03-24-2013   Right greater saphenous vein  by Curt Jews MD  . EYE SURGERY    . HERNIA REPAIR      Current Outpatient Rx  . Order #: [id number] Class: Historical Med  . Order #: [id number] Class: Historical Med  . Order #: [id number] Class: Historical Med  . Order #: [id number] Class: Historical Med  . Order #: [id number] Class: Historical Med  . Order #: [id number] Class: Normal  . Order #: [id number] Class: Historical Med  . Order #: [id number] Class: Historical Med  . Order #: [id number] Class: Normal  .  Order #: [id number] Class: Historical Med  . Order #: [id number] Class: Normal  . Order #: [id number] Class: Historical Med  . Order #: [id number] Class: Historical Med  . Order #: [id number] Class: Historical Med  . Order #: [id number] Class: Historical Med  . Order #: [id number] Class: Print    Allergies Morphine  Family History  Problem Relation Age of Onset  . Diabetes Mother   . Hypertension Mother     Social History Social History  Substance Use Topics  . Smoking status: Former Smoker    Packs/day: 0.80    Years: 40.00    Types: Cigars    Quit date: 09/29/2008  . Smokeless  tobacco: Never Used     Comment: quit cig 30-40 yrs ago / cigars - quit 2010  . Alcohol use No    Review of Systems Constitutional: No fever/chills Eyes: No visual changes. ENT: No sore throat. Denies congestion. Cardiovascular: Denies chest pain. Respiratory: Positive for shortness of breath. Denies cough. Gastrointestinal: No abdominal pain.  No nausea, no vomiting.  No diarrhea.  No constipation. Genitourinary: Negative for dysuria. Musculoskeletal: Negative for back pain. Skin: Negative for rash. Neurological: Negative for headaches, focal weakness or numbness.  10-point ROS otherwise negative.  ____________________________________________   PHYSICAL EXAM:  VITAL SIGNS: Temp: 97.5 F Resp: 21 Pulse: 71 BP: 170/92  Constitutional: Alert and oriented. Well appearing and in no acute distress. Eyes: Conjunctivae are normal.  Head: Atraumatic. Nose: No congestion/rhinnorhea. Mouth/Throat: Mucous membranes are moist.  Oropharynx non-erythematous. Neck: No stridor.   Cardiovascular: Normal rate, regular rhythm. Good peripheral circulation. Grossly normal heart sounds.   Respiratory: Normal respiratory effort.  No retractions. Lungs are diminished with slight expiratory wheezing.  Gastrointestinal: Soft and nontender. No distention.  Musculoskeletal: No lower extremity tenderness nor edema. No gross deformities of extremities. Neurologic:  Normal speech and language. No gross focal neurologic deficits are appreciated.  Skin:  Skin is warm, dry and intact. No rash noted.  ____________________________________________   LABS (all labs ordered are listed, but only abnormal results are displayed)  Labs Reviewed  BASIC METABOLIC PANEL - Abnormal; Notable for the following:       Result Value   Glucose, Bld 193 (*)    BUN 35 (*)    GFR calc non Af Amer 54 (*)    All other components within normal limits  CBC WITH DIFFERENTIAL/PLATELET - Abnormal; Notable for the following:     RBC 3.49 (*)    Hemoglobin 11.1 (*)    HCT 35.1 (*)    MCV 100.6 (*)    All other components within normal limits  PROTIME-INR - Abnormal; Notable for the following:    Prothrombin Time 34.3 (*)    All other components within normal limits  BRAIN NATRIURETIC PEPTIDE - Abnormal; Notable for the following:    B Natriuretic Peptide 517.0 (*)    All other components within normal limits  I-STAT TROPOININ, ED  I-STAT TROPOININ, ED   ____________________________________________  EKG   EKG Interpretation  Date/Time:  Saturday September 27 2016 11:15:53 EST Ventricular Rate:  74 PR Interval:    QRS Duration: 111 QT Interval:  426 QTC Calculation: 473 R Axis:   -47 Text Interpretation:  Sinus rhythm Borderline prolonged PR interval Left anterior fascicular block Anterior infarct, old Minimal ST elevation, lateral leads Baseline wander in lead(s) V1 No STEMI.  Confirmed by Gabriella Guile MD, Annisha Baar [phone]) on 09/27/2016 12:46:22 PM       ____________________________________________  RADIOLOGY  Dg Chest 2 View  Result  Date: 09/27/2016 CLINICAL DATA:  Shortness of breath for 2 days EXAM: CHEST  2 VIEW COMPARISON:  12/29/2014 FINDINGS: Cardiomegaly. Chronic right pleural effusion or pleural thickening with chronic density at the right lung base, unchanged since prior study. IMPRESSION: Stable chronic right pleural thickening or pleural effusion with right base atelectasis or scarring. Stable cardiomegaly. Electronically Signed   By: Rolm Baptise M.D.   On: 09/27/2016 12:10    ____________________________________________   PROCEDURES  Procedure(s) performed:   Procedures   ____________________________________________   INITIAL IMPRESSION / ASSESSMENT AND PLAN / ED COURSE  Pertinent labs & imaging results that were available during my care of the patient were reviewed by me and considered in my medical decision making (see chart for details).  Patient resents to the emergency  department for evaluation of gradually worsening shortness of breath. He has pitting edema in the lower extremities and he states is not worse than normal. No associated chest pain or heart palpitations. Patient has diminished sounds bilaterally with faint expiratory wheezing. He is unaware of a history of COPD but does have a listed history of tobacco abuse and COPD in Epic.   No evidence of fluid on CXR. Patient feels better after albuterol neb. Serial troponin is negative. Plan for discharge home with albuterol and PCP follow up next week.   At this time, I do not feel there is any life-threatening condition present. I have reviewed and discussed all results (EKG, imaging, lab, urine as appropriate), exam findings with patient. I have reviewed nursing notes and appropriate previous records.  I feel the patient is safe to be discharged home without further emergent workup. Discussed usual and customary return precautions. Patient and family (if present) verbalize understanding and are comfortable with this plan.  Patient will follow-up with their primary care provider. If they do not have a primary care provider, information for follow-up has been provided to them. All questions have been answered.  ____________________________________________  FINAL CLINICAL IMPRESSION(S) / ED DIAGNOSES  Final diagnoses:  Shortness of breath     MEDICATIONS GIVEN DURING THIS VISIT:  Medications  ipratropium-albuterol (DUONEB) 0.5-2.5 (3) MG/3ML nebulizer solution 3 mL (3 mLs Nebulization Given 09/27/16 1318)  albuterol (PROVENTIL HFA;VENTOLIN HFA) 108 (90 Base) MCG/ACT inhaler 2 puff (2 puffs Inhalation Given 09/27/16 1516)     NEW OUTPATIENT MEDICATIONS STARTED DURING THIS VISIT:  New Prescriptions   ALBUTEROL (PROVENTIL HFA;VENTOLIN HFA) 108 (90 BASE) MCG/ACT INHALER    Inhale 1-2 puffs into the lungs every 6 (six) hours as needed for wheezing or shortness of breath.    I personally performed the  services described in this documentation, which was scribed in my presence. The recorded information has been reviewed and is accurate.     Note:  This document was prepared using Dragon voice recognition software and may include unintentional dictation errors.  Nanda Quinton, MD Emergency Medicine    Margette Fast, MD 09/27/16 [phone]

## 2016-09-27 NOTE — Discharge Instructions (Signed)
We believe that your symptoms are caused today by an exacerbation of your bronchitis.  Please take the prescribed medications as directed. Follow up with your doctor as recommended.  If you develop any new or worsening symptoms, including but not limited to fever, persistent vomiting, worsening shortness of breath, or other symptoms that concern you, please return to the Emergency Department immediately.

## 2016-09-27 NOTE — ED Triage Notes (Signed)
Patient complains of shortness of breath x 2 days. Denies chest pain, nausea, fever, diarrhea.

## 2016-09-30 DIAGNOSIS — J208 Acute bronchitis due to other specified organisms: Secondary | ICD-10-CM | POA: Diagnosis not present

## 2016-10-16 DIAGNOSIS — E1142 Type 2 diabetes mellitus with diabetic polyneuropathy: Secondary | ICD-10-CM | POA: Diagnosis not present

## 2016-10-16 DIAGNOSIS — I10 Essential (primary) hypertension: Secondary | ICD-10-CM | POA: Diagnosis not present

## 2016-10-16 DIAGNOSIS — I481 Persistent atrial fibrillation: Secondary | ICD-10-CM | POA: Diagnosis not present

## 2016-10-16 DIAGNOSIS — M1009 Idiopathic gout, multiple sites: Secondary | ICD-10-CM | POA: Diagnosis not present

## 2016-10-16 DIAGNOSIS — N183 Chronic kidney disease, stage 3 (moderate): Secondary | ICD-10-CM | POA: Diagnosis not present

## 2016-10-20 DIAGNOSIS — R531 Weakness: Secondary | ICD-10-CM | POA: Diagnosis present

## 2016-10-20 DIAGNOSIS — N189 Chronic kidney disease, unspecified: Secondary | ICD-10-CM | POA: Diagnosis present

## 2016-10-20 DIAGNOSIS — I5041 Acute combined systolic (congestive) and diastolic (congestive) heart failure: Secondary | ICD-10-CM | POA: Diagnosis not present

## 2016-10-20 DIAGNOSIS — I130 Hypertensive heart and chronic kidney disease with heart failure and stage 1 through stage 4 chronic kidney disease, or unspecified chronic kidney disease: Secondary | ICD-10-CM | POA: Diagnosis present

## 2016-10-20 DIAGNOSIS — R2681 Unsteadiness on feet: Secondary | ICD-10-CM | POA: Diagnosis not present

## 2016-10-20 DIAGNOSIS — Z23 Encounter for immunization: Secondary | ICD-10-CM | POA: Diagnosis not present

## 2016-10-20 DIAGNOSIS — R1112 Projectile vomiting: Secondary | ICD-10-CM | POA: Diagnosis present

## 2016-10-20 DIAGNOSIS — T3695XA Adverse effect of unspecified systemic antibiotic, initial encounter: Secondary | ICD-10-CM | POA: Diagnosis present

## 2016-10-20 DIAGNOSIS — F329 Major depressive disorder, single episode, unspecified: Secondary | ICD-10-CM | POA: Diagnosis not present

## 2016-10-20 DIAGNOSIS — R278 Other lack of coordination: Secondary | ICD-10-CM | POA: Diagnosis not present

## 2016-10-20 DIAGNOSIS — J17 Pneumonia in diseases classified elsewhere: Secondary | ICD-10-CM | POA: Diagnosis not present

## 2016-10-20 DIAGNOSIS — Z7901 Long term (current) use of anticoagulants: Secondary | ICD-10-CM | POA: Diagnosis not present

## 2016-10-20 DIAGNOSIS — I110 Hypertensive heart disease with heart failure: Secondary | ICD-10-CM | POA: Diagnosis present

## 2016-10-20 DIAGNOSIS — M109 Gout, unspecified: Secondary | ICD-10-CM | POA: Diagnosis present

## 2016-10-20 DIAGNOSIS — J9600 Acute respiratory failure, unspecified whether with hypoxia or hypercapnia: Secondary | ICD-10-CM | POA: Diagnosis not present

## 2016-10-20 DIAGNOSIS — I509 Heart failure, unspecified: Secondary | ICD-10-CM | POA: Diagnosis not present

## 2016-10-20 DIAGNOSIS — M6281 Muscle weakness (generalized): Secondary | ICD-10-CM | POA: Diagnosis not present

## 2016-10-20 DIAGNOSIS — D649 Anemia, unspecified: Secondary | ICD-10-CM | POA: Diagnosis present

## 2016-10-20 DIAGNOSIS — I214 Non-ST elevation (NSTEMI) myocardial infarction: Secondary | ICD-10-CM | POA: Diagnosis not present

## 2016-10-20 DIAGNOSIS — J9602 Acute respiratory failure with hypercapnia: Secondary | ICD-10-CM | POA: Diagnosis not present

## 2016-10-20 DIAGNOSIS — N184 Chronic kidney disease, stage 4 (severe): Secondary | ICD-10-CM | POA: Diagnosis not present

## 2016-10-20 DIAGNOSIS — R791 Abnormal coagulation profile: Secondary | ICD-10-CM | POA: Diagnosis present

## 2016-10-20 DIAGNOSIS — I5043 Acute on chronic combined systolic (congestive) and diastolic (congestive) heart failure: Secondary | ICD-10-CM | POA: Diagnosis not present

## 2016-10-20 DIAGNOSIS — F05 Delirium due to known physiological condition: Secondary | ICD-10-CM | POA: Diagnosis present

## 2016-10-20 DIAGNOSIS — J440 Chronic obstructive pulmonary disease with acute lower respiratory infection: Secondary | ICD-10-CM | POA: Diagnosis present

## 2016-10-20 DIAGNOSIS — Z7984 Long term (current) use of oral hypoglycemic drugs: Secondary | ICD-10-CM | POA: Diagnosis not present

## 2016-10-20 DIAGNOSIS — I482 Chronic atrial fibrillation: Secondary | ICD-10-CM | POA: Diagnosis not present

## 2016-10-20 DIAGNOSIS — F419 Anxiety disorder, unspecified: Secondary | ICD-10-CM | POA: Diagnosis present

## 2016-10-20 DIAGNOSIS — R410 Disorientation, unspecified: Secondary | ICD-10-CM | POA: Diagnosis present

## 2016-10-20 DIAGNOSIS — E1122 Type 2 diabetes mellitus with diabetic chronic kidney disease: Secondary | ICD-10-CM | POA: Diagnosis present

## 2016-10-20 DIAGNOSIS — E119 Type 2 diabetes mellitus without complications: Secondary | ICD-10-CM | POA: Diagnosis not present

## 2016-10-20 DIAGNOSIS — R079 Chest pain, unspecified: Secondary | ICD-10-CM | POA: Diagnosis not present

## 2016-10-20 DIAGNOSIS — J189 Pneumonia, unspecified organism: Secondary | ICD-10-CM | POA: Diagnosis not present

## 2016-10-20 DIAGNOSIS — E785 Hyperlipidemia, unspecified: Secondary | ICD-10-CM | POA: Diagnosis present

## 2016-10-20 DIAGNOSIS — Z79899 Other long term (current) drug therapy: Secondary | ICD-10-CM | POA: Diagnosis not present

## 2016-10-20 DIAGNOSIS — J9601 Acute respiratory failure with hypoxia: Secondary | ICD-10-CM | POA: Diagnosis present

## 2016-10-27 DIAGNOSIS — J9602 Acute respiratory failure with hypercapnia: Secondary | ICD-10-CM | POA: Diagnosis not present

## 2016-10-27 DIAGNOSIS — I5043 Acute on chronic combined systolic (congestive) and diastolic (congestive) heart failure: Secondary | ICD-10-CM | POA: Diagnosis not present

## 2016-10-27 DIAGNOSIS — N184 Chronic kidney disease, stage 4 (severe): Secondary | ICD-10-CM | POA: Diagnosis not present

## 2016-10-27 DIAGNOSIS — I214 Non-ST elevation (NSTEMI) myocardial infarction: Secondary | ICD-10-CM | POA: Diagnosis not present

## 2016-10-27 DIAGNOSIS — H353110 Nonexudative age-related macular degeneration, right eye, stage unspecified: Secondary | ICD-10-CM | POA: Diagnosis not present

## 2016-10-27 DIAGNOSIS — J9600 Acute respiratory failure, unspecified whether with hypoxia or hypercapnia: Secondary | ICD-10-CM | POA: Diagnosis not present

## 2016-10-27 DIAGNOSIS — J17 Pneumonia in diseases classified elsewhere: Secondary | ICD-10-CM | POA: Diagnosis not present

## 2016-10-27 DIAGNOSIS — F05 Delirium due to known physiological condition: Secondary | ICD-10-CM | POA: Diagnosis not present

## 2016-10-27 DIAGNOSIS — F329 Major depressive disorder, single episode, unspecified: Secondary | ICD-10-CM | POA: Diagnosis not present

## 2016-10-27 DIAGNOSIS — J441 Chronic obstructive pulmonary disease with (acute) exacerbation: Secondary | ICD-10-CM | POA: Diagnosis not present

## 2016-10-27 DIAGNOSIS — E1151 Type 2 diabetes mellitus with diabetic peripheral angiopathy without gangrene: Secondary | ICD-10-CM | POA: Diagnosis not present

## 2016-10-27 DIAGNOSIS — E785 Hyperlipidemia, unspecified: Secondary | ICD-10-CM | POA: Diagnosis not present

## 2016-10-27 DIAGNOSIS — D649 Anemia, unspecified: Secondary | ICD-10-CM | POA: Diagnosis not present

## 2016-10-27 DIAGNOSIS — J440 Chronic obstructive pulmonary disease with acute lower respiratory infection: Secondary | ICD-10-CM | POA: Diagnosis not present

## 2016-10-27 DIAGNOSIS — I110 Hypertensive heart disease with heart failure: Secondary | ICD-10-CM | POA: Diagnosis not present

## 2016-10-27 DIAGNOSIS — I509 Heart failure, unspecified: Secondary | ICD-10-CM | POA: Diagnosis not present

## 2016-10-27 DIAGNOSIS — I482 Chronic atrial fibrillation: Secondary | ICD-10-CM | POA: Diagnosis not present

## 2016-10-27 DIAGNOSIS — L110 Acquired keratosis follicularis: Secondary | ICD-10-CM | POA: Diagnosis not present

## 2016-10-27 DIAGNOSIS — J189 Pneumonia, unspecified organism: Secondary | ICD-10-CM | POA: Diagnosis not present

## 2016-10-27 DIAGNOSIS — B351 Tinea unguium: Secondary | ICD-10-CM | POA: Diagnosis not present

## 2016-10-27 DIAGNOSIS — R278 Other lack of coordination: Secondary | ICD-10-CM | POA: Diagnosis not present

## 2016-10-27 DIAGNOSIS — M109 Gout, unspecified: Secondary | ICD-10-CM | POA: Diagnosis not present

## 2016-10-27 DIAGNOSIS — I5022 Chronic systolic (congestive) heart failure: Secondary | ICD-10-CM | POA: Diagnosis not present

## 2016-10-27 DIAGNOSIS — E1140 Type 2 diabetes mellitus with diabetic neuropathy, unspecified: Secondary | ICD-10-CM | POA: Diagnosis not present

## 2016-10-27 DIAGNOSIS — R2681 Unsteadiness on feet: Secondary | ICD-10-CM | POA: Diagnosis not present

## 2016-10-27 DIAGNOSIS — Z23 Encounter for immunization: Secondary | ICD-10-CM | POA: Diagnosis not present

## 2016-10-27 DIAGNOSIS — M6281 Muscle weakness (generalized): Secondary | ICD-10-CM | POA: Diagnosis not present

## 2016-10-27 DIAGNOSIS — E119 Type 2 diabetes mellitus without complications: Secondary | ICD-10-CM | POA: Diagnosis not present

## 2016-11-30 DIAGNOSIS — I482 Chronic atrial fibrillation: Secondary | ICD-10-CM | POA: Diagnosis not present

## 2016-11-30 DIAGNOSIS — E119 Type 2 diabetes mellitus without complications: Secondary | ICD-10-CM | POA: Diagnosis not present

## 2016-11-30 DIAGNOSIS — J441 Chronic obstructive pulmonary disease with (acute) exacerbation: Secondary | ICD-10-CM | POA: Diagnosis not present

## 2016-11-30 DIAGNOSIS — I5022 Chronic systolic (congestive) heart failure: Secondary | ICD-10-CM | POA: Diagnosis not present

## 2016-12-31 DIAGNOSIS — L110 Acquired keratosis follicularis: Secondary | ICD-10-CM | POA: Diagnosis not present

## 2016-12-31 DIAGNOSIS — B351 Tinea unguium: Secondary | ICD-10-CM | POA: Diagnosis not present

## 2016-12-31 DIAGNOSIS — E1151 Type 2 diabetes mellitus with diabetic peripheral angiopathy without gangrene: Secondary | ICD-10-CM | POA: Diagnosis not present

## 2016-12-31 DIAGNOSIS — E1140 Type 2 diabetes mellitus with diabetic neuropathy, unspecified: Secondary | ICD-10-CM | POA: Diagnosis not present

## 2017-01-15 DIAGNOSIS — H353110 Nonexudative age-related macular degeneration, right eye, stage unspecified: Secondary | ICD-10-CM | POA: Diagnosis not present

## 2017-01-16 DIAGNOSIS — E119 Type 2 diabetes mellitus without complications: Secondary | ICD-10-CM | POA: Diagnosis not present

## 2017-01-16 DIAGNOSIS — I5022 Chronic systolic (congestive) heart failure: Secondary | ICD-10-CM | POA: Diagnosis not present

## 2017-01-16 DIAGNOSIS — I482 Chronic atrial fibrillation: Secondary | ICD-10-CM | POA: Diagnosis not present

## 2017-01-16 DIAGNOSIS — J441 Chronic obstructive pulmonary disease with (acute) exacerbation: Secondary | ICD-10-CM | POA: Diagnosis not present

## 2017-02-04 ENCOUNTER — Encounter (INDEPENDENT_AMBULATORY_CARE_PROVIDER_SITE_OTHER): Payer: Medicare Other | Admitting: Ophthalmology

## 2017-02-05 DIAGNOSIS — N189 Chronic kidney disease, unspecified: Secondary | ICD-10-CM | POA: Diagnosis not present

## 2017-02-05 DIAGNOSIS — S81801D Unspecified open wound, right lower leg, subsequent encounter: Secondary | ICD-10-CM | POA: Diagnosis not present

## 2017-02-05 DIAGNOSIS — I5041 Acute combined systolic (congestive) and diastolic (congestive) heart failure: Secondary | ICD-10-CM | POA: Diagnosis not present

## 2017-02-05 DIAGNOSIS — Z5181 Encounter for therapeutic drug level monitoring: Secondary | ICD-10-CM | POA: Diagnosis not present

## 2017-02-05 DIAGNOSIS — Z8701 Personal history of pneumonia (recurrent): Secondary | ICD-10-CM | POA: Diagnosis not present

## 2017-02-05 DIAGNOSIS — Z87891 Personal history of nicotine dependence: Secondary | ICD-10-CM | POA: Diagnosis not present

## 2017-02-05 DIAGNOSIS — I130 Hypertensive heart and chronic kidney disease with heart failure and stage 1 through stage 4 chronic kidney disease, or unspecified chronic kidney disease: Secondary | ICD-10-CM | POA: Diagnosis not present

## 2017-02-05 DIAGNOSIS — E1122 Type 2 diabetes mellitus with diabetic chronic kidney disease: Secondary | ICD-10-CM | POA: Diagnosis not present

## 2017-02-05 DIAGNOSIS — J449 Chronic obstructive pulmonary disease, unspecified: Secondary | ICD-10-CM | POA: Diagnosis not present

## 2017-02-05 DIAGNOSIS — Z7901 Long term (current) use of anticoagulants: Secondary | ICD-10-CM | POA: Diagnosis not present

## 2017-02-05 DIAGNOSIS — Z7984 Long term (current) use of oral hypoglycemic drugs: Secondary | ICD-10-CM | POA: Diagnosis not present

## 2017-02-05 DIAGNOSIS — I482 Chronic atrial fibrillation: Secondary | ICD-10-CM | POA: Diagnosis not present

## 2017-02-06 ENCOUNTER — Other Ambulatory Visit (HOSPITAL_COMMUNITY)
Admission: RE | Admit: 2017-02-06 | Discharge: 2017-02-06 | Disposition: A | Discharge status: Home / Self Care | Payer: Medicare Other | Source: Other Acute Inpatient Hospital | Attending: Internal Medicine | Admitting: Internal Medicine

## 2017-02-06 DIAGNOSIS — E1122 Type 2 diabetes mellitus with diabetic chronic kidney disease: Secondary | ICD-10-CM | POA: Diagnosis not present

## 2017-02-06 DIAGNOSIS — S81801D Unspecified open wound, right lower leg, subsequent encounter: Secondary | ICD-10-CM | POA: Diagnosis not present

## 2017-02-06 DIAGNOSIS — Z7901 Long term (current) use of anticoagulants: Secondary | ICD-10-CM | POA: Insufficient documentation

## 2017-02-06 DIAGNOSIS — I130 Hypertensive heart and chronic kidney disease with heart failure and stage 1 through stage 4 chronic kidney disease, or unspecified chronic kidney disease: Secondary | ICD-10-CM | POA: Diagnosis not present

## 2017-02-06 DIAGNOSIS — I5041 Acute combined systolic (congestive) and diastolic (congestive) heart failure: Secondary | ICD-10-CM | POA: Diagnosis not present

## 2017-02-06 DIAGNOSIS — N189 Chronic kidney disease, unspecified: Secondary | ICD-10-CM | POA: Diagnosis not present

## 2017-02-06 DIAGNOSIS — I482 Chronic atrial fibrillation: Secondary | ICD-10-CM | POA: Diagnosis not present

## 2017-02-06 LAB — PROTIME-INR
INR: 2.39
Prothrombin Time: 26.5 seconds — ABNORMAL HIGH (ref 11.4–15.2)

## 2017-02-09 DIAGNOSIS — I482 Chronic atrial fibrillation: Secondary | ICD-10-CM | POA: Diagnosis not present

## 2017-02-09 DIAGNOSIS — I130 Hypertensive heart and chronic kidney disease with heart failure and stage 1 through stage 4 chronic kidney disease, or unspecified chronic kidney disease: Secondary | ICD-10-CM | POA: Diagnosis not present

## 2017-02-09 DIAGNOSIS — S81801D Unspecified open wound, right lower leg, subsequent encounter: Secondary | ICD-10-CM | POA: Diagnosis not present

## 2017-02-09 DIAGNOSIS — E1122 Type 2 diabetes mellitus with diabetic chronic kidney disease: Secondary | ICD-10-CM | POA: Diagnosis not present

## 2017-02-09 DIAGNOSIS — N189 Chronic kidney disease, unspecified: Secondary | ICD-10-CM | POA: Diagnosis not present

## 2017-02-09 DIAGNOSIS — I5041 Acute combined systolic (congestive) and diastolic (congestive) heart failure: Secondary | ICD-10-CM | POA: Diagnosis not present

## 2017-02-10 DIAGNOSIS — I482 Chronic atrial fibrillation: Secondary | ICD-10-CM | POA: Diagnosis not present

## 2017-02-10 DIAGNOSIS — M1009 Idiopathic gout, multiple sites: Secondary | ICD-10-CM | POA: Diagnosis not present

## 2017-02-10 DIAGNOSIS — M791 Myalgia: Secondary | ICD-10-CM | POA: Diagnosis not present

## 2017-02-11 ENCOUNTER — Encounter (INDEPENDENT_AMBULATORY_CARE_PROVIDER_SITE_OTHER): Payer: Medicare Other | Admitting: Ophthalmology

## 2017-02-11 DIAGNOSIS — H26493 Other secondary cataract, bilateral: Secondary | ICD-10-CM

## 2017-02-11 DIAGNOSIS — H353134 Nonexudative age-related macular degeneration, bilateral, advanced atrophic with subfoveal involvement: Secondary | ICD-10-CM

## 2017-02-11 DIAGNOSIS — I10 Essential (primary) hypertension: Secondary | ICD-10-CM

## 2017-02-11 DIAGNOSIS — H35033 Hypertensive retinopathy, bilateral: Secondary | ICD-10-CM | POA: Diagnosis not present

## 2017-02-11 DIAGNOSIS — H2512 Age-related nuclear cataract, left eye: Secondary | ICD-10-CM | POA: Diagnosis not present

## 2017-02-11 DIAGNOSIS — H43813 Vitreous degeneration, bilateral: Secondary | ICD-10-CM

## 2017-02-12 DIAGNOSIS — N189 Chronic kidney disease, unspecified: Secondary | ICD-10-CM | POA: Diagnosis not present

## 2017-02-12 DIAGNOSIS — I5041 Acute combined systolic (congestive) and diastolic (congestive) heart failure: Secondary | ICD-10-CM | POA: Diagnosis not present

## 2017-02-12 DIAGNOSIS — S81801D Unspecified open wound, right lower leg, subsequent encounter: Secondary | ICD-10-CM | POA: Diagnosis not present

## 2017-02-12 DIAGNOSIS — I482 Chronic atrial fibrillation: Secondary | ICD-10-CM | POA: Diagnosis not present

## 2017-02-12 DIAGNOSIS — E1122 Type 2 diabetes mellitus with diabetic chronic kidney disease: Secondary | ICD-10-CM | POA: Diagnosis not present

## 2017-02-12 DIAGNOSIS — I130 Hypertensive heart and chronic kidney disease with heart failure and stage 1 through stage 4 chronic kidney disease, or unspecified chronic kidney disease: Secondary | ICD-10-CM | POA: Diagnosis not present

## 2017-02-13 DIAGNOSIS — I130 Hypertensive heart and chronic kidney disease with heart failure and stage 1 through stage 4 chronic kidney disease, or unspecified chronic kidney disease: Secondary | ICD-10-CM | POA: Diagnosis not present

## 2017-02-13 DIAGNOSIS — I482 Chronic atrial fibrillation: Secondary | ICD-10-CM | POA: Diagnosis not present

## 2017-02-13 DIAGNOSIS — S81801D Unspecified open wound, right lower leg, subsequent encounter: Secondary | ICD-10-CM | POA: Diagnosis not present

## 2017-02-13 DIAGNOSIS — E1122 Type 2 diabetes mellitus with diabetic chronic kidney disease: Secondary | ICD-10-CM | POA: Diagnosis not present

## 2017-02-13 DIAGNOSIS — N189 Chronic kidney disease, unspecified: Secondary | ICD-10-CM | POA: Diagnosis not present

## 2017-02-13 DIAGNOSIS — I5041 Acute combined systolic (congestive) and diastolic (congestive) heart failure: Secondary | ICD-10-CM | POA: Diagnosis not present

## 2017-02-16 DIAGNOSIS — S81801D Unspecified open wound, right lower leg, subsequent encounter: Secondary | ICD-10-CM | POA: Diagnosis not present

## 2017-02-16 DIAGNOSIS — N189 Chronic kidney disease, unspecified: Secondary | ICD-10-CM | POA: Diagnosis not present

## 2017-02-16 DIAGNOSIS — I482 Chronic atrial fibrillation: Secondary | ICD-10-CM | POA: Diagnosis not present

## 2017-02-16 DIAGNOSIS — I5041 Acute combined systolic (congestive) and diastolic (congestive) heart failure: Secondary | ICD-10-CM | POA: Diagnosis not present

## 2017-02-16 DIAGNOSIS — I130 Hypertensive heart and chronic kidney disease with heart failure and stage 1 through stage 4 chronic kidney disease, or unspecified chronic kidney disease: Secondary | ICD-10-CM | POA: Diagnosis not present

## 2017-02-16 DIAGNOSIS — E1122 Type 2 diabetes mellitus with diabetic chronic kidney disease: Secondary | ICD-10-CM | POA: Diagnosis not present

## 2017-02-17 DIAGNOSIS — N189 Chronic kidney disease, unspecified: Secondary | ICD-10-CM | POA: Diagnosis not present

## 2017-02-17 DIAGNOSIS — E1122 Type 2 diabetes mellitus with diabetic chronic kidney disease: Secondary | ICD-10-CM | POA: Diagnosis not present

## 2017-02-17 DIAGNOSIS — I130 Hypertensive heart and chronic kidney disease with heart failure and stage 1 through stage 4 chronic kidney disease, or unspecified chronic kidney disease: Secondary | ICD-10-CM | POA: Diagnosis not present

## 2017-02-17 DIAGNOSIS — S81801D Unspecified open wound, right lower leg, subsequent encounter: Secondary | ICD-10-CM | POA: Diagnosis not present

## 2017-02-17 DIAGNOSIS — I5041 Acute combined systolic (congestive) and diastolic (congestive) heart failure: Secondary | ICD-10-CM | POA: Diagnosis not present

## 2017-02-17 DIAGNOSIS — I482 Chronic atrial fibrillation: Secondary | ICD-10-CM | POA: Diagnosis not present

## 2017-02-18 DIAGNOSIS — I130 Hypertensive heart and chronic kidney disease with heart failure and stage 1 through stage 4 chronic kidney disease, or unspecified chronic kidney disease: Secondary | ICD-10-CM | POA: Diagnosis not present

## 2017-02-18 DIAGNOSIS — I5041 Acute combined systolic (congestive) and diastolic (congestive) heart failure: Secondary | ICD-10-CM | POA: Diagnosis not present

## 2017-02-18 DIAGNOSIS — I482 Chronic atrial fibrillation: Secondary | ICD-10-CM | POA: Diagnosis not present

## 2017-02-18 DIAGNOSIS — N189 Chronic kidney disease, unspecified: Secondary | ICD-10-CM | POA: Diagnosis not present

## 2017-02-18 DIAGNOSIS — E1122 Type 2 diabetes mellitus with diabetic chronic kidney disease: Secondary | ICD-10-CM | POA: Diagnosis not present

## 2017-02-18 DIAGNOSIS — S81801D Unspecified open wound, right lower leg, subsequent encounter: Secondary | ICD-10-CM | POA: Diagnosis not present

## 2017-02-19 DIAGNOSIS — I482 Chronic atrial fibrillation: Secondary | ICD-10-CM | POA: Diagnosis not present

## 2017-02-19 DIAGNOSIS — N189 Chronic kidney disease, unspecified: Secondary | ICD-10-CM | POA: Diagnosis not present

## 2017-02-19 DIAGNOSIS — I130 Hypertensive heart and chronic kidney disease with heart failure and stage 1 through stage 4 chronic kidney disease, or unspecified chronic kidney disease: Secondary | ICD-10-CM | POA: Diagnosis not present

## 2017-02-19 DIAGNOSIS — S81801D Unspecified open wound, right lower leg, subsequent encounter: Secondary | ICD-10-CM | POA: Diagnosis not present

## 2017-02-19 DIAGNOSIS — E1122 Type 2 diabetes mellitus with diabetic chronic kidney disease: Secondary | ICD-10-CM | POA: Diagnosis not present

## 2017-02-19 DIAGNOSIS — I5041 Acute combined systolic (congestive) and diastolic (congestive) heart failure: Secondary | ICD-10-CM | POA: Diagnosis not present

## 2017-02-20 DIAGNOSIS — E1122 Type 2 diabetes mellitus with diabetic chronic kidney disease: Secondary | ICD-10-CM | POA: Diagnosis not present

## 2017-02-20 DIAGNOSIS — N189 Chronic kidney disease, unspecified: Secondary | ICD-10-CM | POA: Diagnosis not present

## 2017-02-20 DIAGNOSIS — S81801D Unspecified open wound, right lower leg, subsequent encounter: Secondary | ICD-10-CM | POA: Diagnosis not present

## 2017-02-20 DIAGNOSIS — I5041 Acute combined systolic (congestive) and diastolic (congestive) heart failure: Secondary | ICD-10-CM | POA: Diagnosis not present

## 2017-02-20 DIAGNOSIS — I130 Hypertensive heart and chronic kidney disease with heart failure and stage 1 through stage 4 chronic kidney disease, or unspecified chronic kidney disease: Secondary | ICD-10-CM | POA: Diagnosis not present

## 2017-02-20 DIAGNOSIS — I482 Chronic atrial fibrillation: Secondary | ICD-10-CM | POA: Diagnosis not present

## 2017-02-24 DIAGNOSIS — I130 Hypertensive heart and chronic kidney disease with heart failure and stage 1 through stage 4 chronic kidney disease, or unspecified chronic kidney disease: Secondary | ICD-10-CM | POA: Diagnosis not present

## 2017-02-24 DIAGNOSIS — E1122 Type 2 diabetes mellitus with diabetic chronic kidney disease: Secondary | ICD-10-CM | POA: Diagnosis not present

## 2017-02-24 DIAGNOSIS — I5041 Acute combined systolic (congestive) and diastolic (congestive) heart failure: Secondary | ICD-10-CM | POA: Diagnosis not present

## 2017-02-24 DIAGNOSIS — S81801D Unspecified open wound, right lower leg, subsequent encounter: Secondary | ICD-10-CM | POA: Diagnosis not present

## 2017-02-24 DIAGNOSIS — I482 Chronic atrial fibrillation: Secondary | ICD-10-CM | POA: Diagnosis not present

## 2017-02-24 DIAGNOSIS — N189 Chronic kidney disease, unspecified: Secondary | ICD-10-CM | POA: Diagnosis not present

## 2017-02-25 DIAGNOSIS — E1122 Type 2 diabetes mellitus with diabetic chronic kidney disease: Secondary | ICD-10-CM | POA: Diagnosis not present

## 2017-02-25 DIAGNOSIS — I482 Chronic atrial fibrillation: Secondary | ICD-10-CM | POA: Diagnosis not present

## 2017-02-25 DIAGNOSIS — S81801D Unspecified open wound, right lower leg, subsequent encounter: Secondary | ICD-10-CM | POA: Diagnosis not present

## 2017-02-25 DIAGNOSIS — N189 Chronic kidney disease, unspecified: Secondary | ICD-10-CM | POA: Diagnosis not present

## 2017-02-25 DIAGNOSIS — I130 Hypertensive heart and chronic kidney disease with heart failure and stage 1 through stage 4 chronic kidney disease, or unspecified chronic kidney disease: Secondary | ICD-10-CM | POA: Diagnosis not present

## 2017-02-25 DIAGNOSIS — I5041 Acute combined systolic (congestive) and diastolic (congestive) heart failure: Secondary | ICD-10-CM | POA: Diagnosis not present

## 2017-02-26 DIAGNOSIS — Z7984 Long term (current) use of oral hypoglycemic drugs: Secondary | ICD-10-CM | POA: Diagnosis not present

## 2017-02-26 DIAGNOSIS — C4921 Malignant neoplasm of connective and soft tissue of right lower limb, including hip: Secondary | ICD-10-CM | POA: Diagnosis not present

## 2017-02-26 DIAGNOSIS — L97811 Non-pressure chronic ulcer of other part of right lower leg limited to breakdown of skin: Secondary | ICD-10-CM | POA: Diagnosis not present

## 2017-02-26 DIAGNOSIS — L97821 Non-pressure chronic ulcer of other part of left lower leg limited to breakdown of skin: Secondary | ICD-10-CM | POA: Diagnosis not present

## 2017-02-26 DIAGNOSIS — Z79899 Other long term (current) drug therapy: Secondary | ICD-10-CM | POA: Diagnosis not present

## 2017-02-26 DIAGNOSIS — J449 Chronic obstructive pulmonary disease, unspecified: Secondary | ICD-10-CM | POA: Diagnosis not present

## 2017-02-26 DIAGNOSIS — I83018 Varicose veins of right lower extremity with ulcer other part of lower leg: Secondary | ICD-10-CM | POA: Diagnosis not present

## 2017-02-26 DIAGNOSIS — L859 Epidermal thickening, unspecified: Secondary | ICD-10-CM | POA: Diagnosis not present

## 2017-02-26 DIAGNOSIS — E119 Type 2 diabetes mellitus without complications: Secondary | ICD-10-CM | POA: Diagnosis not present

## 2017-02-26 DIAGNOSIS — F419 Anxiety disorder, unspecified: Secondary | ICD-10-CM | POA: Diagnosis not present

## 2017-02-26 DIAGNOSIS — I110 Hypertensive heart disease with heart failure: Secondary | ICD-10-CM | POA: Diagnosis not present

## 2017-02-26 DIAGNOSIS — I872 Venous insufficiency (chronic) (peripheral): Secondary | ICD-10-CM | POA: Diagnosis not present

## 2017-02-26 DIAGNOSIS — Z7901 Long term (current) use of anticoagulants: Secondary | ICD-10-CM | POA: Diagnosis not present

## 2017-02-26 DIAGNOSIS — I509 Heart failure, unspecified: Secondary | ICD-10-CM | POA: Diagnosis not present

## 2017-02-27 DIAGNOSIS — I130 Hypertensive heart and chronic kidney disease with heart failure and stage 1 through stage 4 chronic kidney disease, or unspecified chronic kidney disease: Secondary | ICD-10-CM | POA: Diagnosis not present

## 2017-02-27 DIAGNOSIS — I482 Chronic atrial fibrillation: Secondary | ICD-10-CM | POA: Diagnosis not present

## 2017-02-27 DIAGNOSIS — I5041 Acute combined systolic (congestive) and diastolic (congestive) heart failure: Secondary | ICD-10-CM | POA: Diagnosis not present

## 2017-02-27 DIAGNOSIS — E1122 Type 2 diabetes mellitus with diabetic chronic kidney disease: Secondary | ICD-10-CM | POA: Diagnosis not present

## 2017-02-27 DIAGNOSIS — N189 Chronic kidney disease, unspecified: Secondary | ICD-10-CM | POA: Diagnosis not present

## 2017-02-27 DIAGNOSIS — S81801D Unspecified open wound, right lower leg, subsequent encounter: Secondary | ICD-10-CM | POA: Diagnosis not present

## 2017-03-03 DIAGNOSIS — S81801D Unspecified open wound, right lower leg, subsequent encounter: Secondary | ICD-10-CM | POA: Diagnosis not present

## 2017-03-03 DIAGNOSIS — I482 Chronic atrial fibrillation: Secondary | ICD-10-CM | POA: Diagnosis not present

## 2017-03-03 DIAGNOSIS — N189 Chronic kidney disease, unspecified: Secondary | ICD-10-CM | POA: Diagnosis not present

## 2017-03-03 DIAGNOSIS — E1122 Type 2 diabetes mellitus with diabetic chronic kidney disease: Secondary | ICD-10-CM | POA: Diagnosis not present

## 2017-03-03 DIAGNOSIS — I5041 Acute combined systolic (congestive) and diastolic (congestive) heart failure: Secondary | ICD-10-CM | POA: Diagnosis not present

## 2017-03-03 DIAGNOSIS — I130 Hypertensive heart and chronic kidney disease with heart failure and stage 1 through stage 4 chronic kidney disease, or unspecified chronic kidney disease: Secondary | ICD-10-CM | POA: Diagnosis not present

## 2017-03-05 DIAGNOSIS — C44712 Basal cell carcinoma of skin of right lower limb, including hip: Secondary | ICD-10-CM | POA: Diagnosis not present

## 2017-03-05 DIAGNOSIS — I83018 Varicose veins of right lower extremity with ulcer other part of lower leg: Secondary | ICD-10-CM | POA: Diagnosis not present

## 2017-03-05 DIAGNOSIS — L97811 Non-pressure chronic ulcer of other part of right lower leg limited to breakdown of skin: Secondary | ICD-10-CM | POA: Diagnosis not present

## 2017-03-05 DIAGNOSIS — I872 Venous insufficiency (chronic) (peripheral): Secondary | ICD-10-CM | POA: Diagnosis not present

## 2017-03-05 DIAGNOSIS — C44722 Squamous cell carcinoma of skin of right lower limb, including hip: Secondary | ICD-10-CM | POA: Diagnosis not present

## 2017-03-05 DIAGNOSIS — L97821 Non-pressure chronic ulcer of other part of left lower leg limited to breakdown of skin: Secondary | ICD-10-CM | POA: Diagnosis not present

## 2017-03-11 DIAGNOSIS — I130 Hypertensive heart and chronic kidney disease with heart failure and stage 1 through stage 4 chronic kidney disease, or unspecified chronic kidney disease: Secondary | ICD-10-CM | POA: Diagnosis not present

## 2017-03-11 DIAGNOSIS — E1122 Type 2 diabetes mellitus with diabetic chronic kidney disease: Secondary | ICD-10-CM | POA: Diagnosis not present

## 2017-03-11 DIAGNOSIS — N189 Chronic kidney disease, unspecified: Secondary | ICD-10-CM | POA: Diagnosis not present

## 2017-03-11 DIAGNOSIS — I482 Chronic atrial fibrillation: Secondary | ICD-10-CM | POA: Diagnosis not present

## 2017-03-11 DIAGNOSIS — I5041 Acute combined systolic (congestive) and diastolic (congestive) heart failure: Secondary | ICD-10-CM | POA: Diagnosis not present

## 2017-03-11 DIAGNOSIS — S81801D Unspecified open wound, right lower leg, subsequent encounter: Secondary | ICD-10-CM | POA: Diagnosis not present

## 2017-03-12 ENCOUNTER — Other Ambulatory Visit (HOSPITAL_COMMUNITY)
Admission: RE | Admit: 2017-03-12 | Discharge: 2017-03-12 | Disposition: A | Discharge status: Home / Self Care | Payer: Medicare Other | Source: Other Acute Inpatient Hospital | Attending: Internal Medicine | Admitting: Internal Medicine

## 2017-03-12 DIAGNOSIS — Z7901 Long term (current) use of anticoagulants: Secondary | ICD-10-CM | POA: Insufficient documentation

## 2017-03-12 DIAGNOSIS — I83018 Varicose veins of right lower extremity with ulcer other part of lower leg: Secondary | ICD-10-CM | POA: Diagnosis not present

## 2017-03-12 DIAGNOSIS — C44712 Basal cell carcinoma of skin of right lower limb, including hip: Secondary | ICD-10-CM | POA: Diagnosis not present

## 2017-03-12 DIAGNOSIS — C44722 Squamous cell carcinoma of skin of right lower limb, including hip: Secondary | ICD-10-CM | POA: Diagnosis not present

## 2017-03-12 DIAGNOSIS — I872 Venous insufficiency (chronic) (peripheral): Secondary | ICD-10-CM | POA: Diagnosis not present

## 2017-03-12 DIAGNOSIS — L97811 Non-pressure chronic ulcer of other part of right lower leg limited to breakdown of skin: Secondary | ICD-10-CM | POA: Diagnosis not present

## 2017-03-12 LAB — PROTIME-INR
INR: 1.82
Prothrombin Time: 21.3 seconds — ABNORMAL HIGH (ref 11.4–15.2)

## 2017-03-17 DIAGNOSIS — I5041 Acute combined systolic (congestive) and diastolic (congestive) heart failure: Secondary | ICD-10-CM | POA: Diagnosis not present

## 2017-03-17 DIAGNOSIS — I130 Hypertensive heart and chronic kidney disease with heart failure and stage 1 through stage 4 chronic kidney disease, or unspecified chronic kidney disease: Secondary | ICD-10-CM | POA: Diagnosis not present

## 2017-03-17 DIAGNOSIS — E1122 Type 2 diabetes mellitus with diabetic chronic kidney disease: Secondary | ICD-10-CM | POA: Diagnosis not present

## 2017-03-17 DIAGNOSIS — S81801D Unspecified open wound, right lower leg, subsequent encounter: Secondary | ICD-10-CM | POA: Diagnosis not present

## 2017-03-17 DIAGNOSIS — I482 Chronic atrial fibrillation: Secondary | ICD-10-CM | POA: Diagnosis not present

## 2017-03-17 DIAGNOSIS — N189 Chronic kidney disease, unspecified: Secondary | ICD-10-CM | POA: Diagnosis not present

## 2017-03-18 DIAGNOSIS — C4490 Unspecified malignant neoplasm of skin, unspecified: Secondary | ICD-10-CM | POA: Diagnosis not present

## 2017-03-20 DIAGNOSIS — S81801D Unspecified open wound, right lower leg, subsequent encounter: Secondary | ICD-10-CM | POA: Diagnosis not present

## 2017-03-20 DIAGNOSIS — I482 Chronic atrial fibrillation: Secondary | ICD-10-CM | POA: Diagnosis not present

## 2017-03-20 DIAGNOSIS — N189 Chronic kidney disease, unspecified: Secondary | ICD-10-CM | POA: Diagnosis not present

## 2017-03-20 DIAGNOSIS — I130 Hypertensive heart and chronic kidney disease with heart failure and stage 1 through stage 4 chronic kidney disease, or unspecified chronic kidney disease: Secondary | ICD-10-CM | POA: Diagnosis not present

## 2017-03-20 DIAGNOSIS — E1122 Type 2 diabetes mellitus with diabetic chronic kidney disease: Secondary | ICD-10-CM | POA: Diagnosis not present

## 2017-03-20 DIAGNOSIS — I5041 Acute combined systolic (congestive) and diastolic (congestive) heart failure: Secondary | ICD-10-CM | POA: Diagnosis not present

## 2017-03-23 DIAGNOSIS — I130 Hypertensive heart and chronic kidney disease with heart failure and stage 1 through stage 4 chronic kidney disease, or unspecified chronic kidney disease: Secondary | ICD-10-CM | POA: Diagnosis not present

## 2017-03-23 DIAGNOSIS — N189 Chronic kidney disease, unspecified: Secondary | ICD-10-CM | POA: Diagnosis not present

## 2017-03-23 DIAGNOSIS — I5041 Acute combined systolic (congestive) and diastolic (congestive) heart failure: Secondary | ICD-10-CM | POA: Diagnosis not present

## 2017-03-23 DIAGNOSIS — E1122 Type 2 diabetes mellitus with diabetic chronic kidney disease: Secondary | ICD-10-CM | POA: Diagnosis not present

## 2017-03-23 DIAGNOSIS — S81801D Unspecified open wound, right lower leg, subsequent encounter: Secondary | ICD-10-CM | POA: Diagnosis not present

## 2017-03-23 DIAGNOSIS — I482 Chronic atrial fibrillation: Secondary | ICD-10-CM | POA: Diagnosis not present

## 2017-03-25 DIAGNOSIS — Z79899 Other long term (current) drug therapy: Secondary | ICD-10-CM | POA: Diagnosis not present

## 2017-03-25 DIAGNOSIS — Z7901 Long term (current) use of anticoagulants: Secondary | ICD-10-CM | POA: Diagnosis not present

## 2017-03-25 DIAGNOSIS — Z7982 Long term (current) use of aspirin: Secondary | ICD-10-CM | POA: Diagnosis not present

## 2017-03-25 DIAGNOSIS — C44712 Basal cell carcinoma of skin of right lower limb, including hip: Secondary | ICD-10-CM | POA: Diagnosis not present

## 2017-03-25 DIAGNOSIS — Z87891 Personal history of nicotine dependence: Secondary | ICD-10-CM | POA: Diagnosis not present

## 2017-03-25 DIAGNOSIS — Z7984 Long term (current) use of oral hypoglycemic drugs: Secondary | ICD-10-CM | POA: Diagnosis not present

## 2017-03-25 DIAGNOSIS — Z886 Allergy status to analgesic agent status: Secondary | ICD-10-CM | POA: Diagnosis not present

## 2017-03-25 DIAGNOSIS — I10 Essential (primary) hypertension: Secondary | ICD-10-CM | POA: Diagnosis not present

## 2017-03-25 DIAGNOSIS — E119 Type 2 diabetes mellitus without complications: Secondary | ICD-10-CM | POA: Diagnosis not present

## 2017-03-27 DIAGNOSIS — Z7982 Long term (current) use of aspirin: Secondary | ICD-10-CM | POA: Diagnosis not present

## 2017-03-27 DIAGNOSIS — Z7984 Long term (current) use of oral hypoglycemic drugs: Secondary | ICD-10-CM | POA: Diagnosis not present

## 2017-03-27 DIAGNOSIS — I10 Essential (primary) hypertension: Secondary | ICD-10-CM | POA: Diagnosis not present

## 2017-03-27 DIAGNOSIS — C44702 Unspecified malignant neoplasm of skin of right lower limb, including hip: Secondary | ICD-10-CM | POA: Diagnosis not present

## 2017-03-27 DIAGNOSIS — F419 Anxiety disorder, unspecified: Secondary | ICD-10-CM | POA: Diagnosis not present

## 2017-03-27 DIAGNOSIS — M069 Rheumatoid arthritis, unspecified: Secondary | ICD-10-CM | POA: Diagnosis not present

## 2017-03-27 DIAGNOSIS — Z79899 Other long term (current) drug therapy: Secondary | ICD-10-CM | POA: Diagnosis not present

## 2017-03-27 DIAGNOSIS — L989 Disorder of the skin and subcutaneous tissue, unspecified: Secondary | ICD-10-CM | POA: Diagnosis not present

## 2017-03-27 DIAGNOSIS — Z7901 Long term (current) use of anticoagulants: Secondary | ICD-10-CM | POA: Diagnosis not present

## 2017-03-27 DIAGNOSIS — C44712 Basal cell carcinoma of skin of right lower limb, including hip: Secondary | ICD-10-CM | POA: Diagnosis not present

## 2017-03-27 DIAGNOSIS — E119 Type 2 diabetes mellitus without complications: Secondary | ICD-10-CM | POA: Diagnosis not present

## 2017-03-28 DIAGNOSIS — I130 Hypertensive heart and chronic kidney disease with heart failure and stage 1 through stage 4 chronic kidney disease, or unspecified chronic kidney disease: Secondary | ICD-10-CM | POA: Diagnosis not present

## 2017-03-28 DIAGNOSIS — E1122 Type 2 diabetes mellitus with diabetic chronic kidney disease: Secondary | ICD-10-CM | POA: Diagnosis not present

## 2017-03-28 DIAGNOSIS — I5041 Acute combined systolic (congestive) and diastolic (congestive) heart failure: Secondary | ICD-10-CM | POA: Diagnosis not present

## 2017-03-28 DIAGNOSIS — S81801D Unspecified open wound, right lower leg, subsequent encounter: Secondary | ICD-10-CM | POA: Diagnosis not present

## 2017-03-28 DIAGNOSIS — N189 Chronic kidney disease, unspecified: Secondary | ICD-10-CM | POA: Diagnosis not present

## 2017-03-28 DIAGNOSIS — I482 Chronic atrial fibrillation: Secondary | ICD-10-CM | POA: Diagnosis not present

## 2017-03-29 DIAGNOSIS — S81801D Unspecified open wound, right lower leg, subsequent encounter: Secondary | ICD-10-CM | POA: Diagnosis not present

## 2017-03-29 DIAGNOSIS — I130 Hypertensive heart and chronic kidney disease with heart failure and stage 1 through stage 4 chronic kidney disease, or unspecified chronic kidney disease: Secondary | ICD-10-CM | POA: Diagnosis not present

## 2017-03-29 DIAGNOSIS — I482 Chronic atrial fibrillation: Secondary | ICD-10-CM | POA: Diagnosis not present

## 2017-03-29 DIAGNOSIS — I5041 Acute combined systolic (congestive) and diastolic (congestive) heart failure: Secondary | ICD-10-CM | POA: Diagnosis not present

## 2017-03-29 DIAGNOSIS — N189 Chronic kidney disease, unspecified: Secondary | ICD-10-CM | POA: Diagnosis not present

## 2017-03-29 DIAGNOSIS — E1122 Type 2 diabetes mellitus with diabetic chronic kidney disease: Secondary | ICD-10-CM | POA: Diagnosis not present

## 2017-03-30 DIAGNOSIS — I130 Hypertensive heart and chronic kidney disease with heart failure and stage 1 through stage 4 chronic kidney disease, or unspecified chronic kidney disease: Secondary | ICD-10-CM | POA: Diagnosis not present

## 2017-03-30 DIAGNOSIS — N189 Chronic kidney disease, unspecified: Secondary | ICD-10-CM | POA: Diagnosis not present

## 2017-03-30 DIAGNOSIS — E1122 Type 2 diabetes mellitus with diabetic chronic kidney disease: Secondary | ICD-10-CM | POA: Diagnosis not present

## 2017-03-30 DIAGNOSIS — S81801D Unspecified open wound, right lower leg, subsequent encounter: Secondary | ICD-10-CM | POA: Diagnosis not present

## 2017-03-30 DIAGNOSIS — I482 Chronic atrial fibrillation: Secondary | ICD-10-CM | POA: Diagnosis not present

## 2017-03-30 DIAGNOSIS — I5041 Acute combined systolic (congestive) and diastolic (congestive) heart failure: Secondary | ICD-10-CM | POA: Diagnosis not present

## 2017-03-31 DIAGNOSIS — S81801D Unspecified open wound, right lower leg, subsequent encounter: Secondary | ICD-10-CM | POA: Diagnosis not present

## 2017-03-31 DIAGNOSIS — I482 Chronic atrial fibrillation: Secondary | ICD-10-CM | POA: Diagnosis not present

## 2017-03-31 DIAGNOSIS — N189 Chronic kidney disease, unspecified: Secondary | ICD-10-CM | POA: Diagnosis not present

## 2017-03-31 DIAGNOSIS — I130 Hypertensive heart and chronic kidney disease with heart failure and stage 1 through stage 4 chronic kidney disease, or unspecified chronic kidney disease: Secondary | ICD-10-CM | POA: Diagnosis not present

## 2017-03-31 DIAGNOSIS — E1122 Type 2 diabetes mellitus with diabetic chronic kidney disease: Secondary | ICD-10-CM | POA: Diagnosis not present

## 2017-03-31 DIAGNOSIS — I5041 Acute combined systolic (congestive) and diastolic (congestive) heart failure: Secondary | ICD-10-CM | POA: Diagnosis not present

## 2017-04-01 DIAGNOSIS — N189 Chronic kidney disease, unspecified: Secondary | ICD-10-CM | POA: Diagnosis not present

## 2017-04-01 DIAGNOSIS — I130 Hypertensive heart and chronic kidney disease with heart failure and stage 1 through stage 4 chronic kidney disease, or unspecified chronic kidney disease: Secondary | ICD-10-CM | POA: Diagnosis not present

## 2017-04-01 DIAGNOSIS — S81801D Unspecified open wound, right lower leg, subsequent encounter: Secondary | ICD-10-CM | POA: Diagnosis not present

## 2017-04-01 DIAGNOSIS — I482 Chronic atrial fibrillation: Secondary | ICD-10-CM | POA: Diagnosis not present

## 2017-04-01 DIAGNOSIS — I5041 Acute combined systolic (congestive) and diastolic (congestive) heart failure: Secondary | ICD-10-CM | POA: Diagnosis not present

## 2017-04-01 DIAGNOSIS — E1122 Type 2 diabetes mellitus with diabetic chronic kidney disease: Secondary | ICD-10-CM | POA: Diagnosis not present

## 2017-04-02 DIAGNOSIS — N189 Chronic kidney disease, unspecified: Secondary | ICD-10-CM | POA: Diagnosis not present

## 2017-04-02 DIAGNOSIS — E1122 Type 2 diabetes mellitus with diabetic chronic kidney disease: Secondary | ICD-10-CM | POA: Diagnosis not present

## 2017-04-02 DIAGNOSIS — I482 Chronic atrial fibrillation: Secondary | ICD-10-CM | POA: Diagnosis not present

## 2017-04-02 DIAGNOSIS — S81801D Unspecified open wound, right lower leg, subsequent encounter: Secondary | ICD-10-CM | POA: Diagnosis not present

## 2017-04-02 DIAGNOSIS — I130 Hypertensive heart and chronic kidney disease with heart failure and stage 1 through stage 4 chronic kidney disease, or unspecified chronic kidney disease: Secondary | ICD-10-CM | POA: Diagnosis not present

## 2017-04-02 DIAGNOSIS — I5041 Acute combined systolic (congestive) and diastolic (congestive) heart failure: Secondary | ICD-10-CM | POA: Diagnosis not present

## 2017-04-03 DIAGNOSIS — E1122 Type 2 diabetes mellitus with diabetic chronic kidney disease: Secondary | ICD-10-CM | POA: Diagnosis not present

## 2017-04-03 DIAGNOSIS — I5041 Acute combined systolic (congestive) and diastolic (congestive) heart failure: Secondary | ICD-10-CM | POA: Diagnosis not present

## 2017-04-03 DIAGNOSIS — N189 Chronic kidney disease, unspecified: Secondary | ICD-10-CM | POA: Diagnosis not present

## 2017-04-03 DIAGNOSIS — I482 Chronic atrial fibrillation: Secondary | ICD-10-CM | POA: Diagnosis not present

## 2017-04-03 DIAGNOSIS — I130 Hypertensive heart and chronic kidney disease with heart failure and stage 1 through stage 4 chronic kidney disease, or unspecified chronic kidney disease: Secondary | ICD-10-CM | POA: Diagnosis not present

## 2017-04-03 DIAGNOSIS — Z7901 Long term (current) use of anticoagulants: Secondary | ICD-10-CM | POA: Diagnosis not present

## 2017-04-03 DIAGNOSIS — S81801D Unspecified open wound, right lower leg, subsequent encounter: Secondary | ICD-10-CM | POA: Diagnosis not present

## 2017-04-04 DIAGNOSIS — E1122 Type 2 diabetes mellitus with diabetic chronic kidney disease: Secondary | ICD-10-CM | POA: Diagnosis not present

## 2017-04-04 DIAGNOSIS — I130 Hypertensive heart and chronic kidney disease with heart failure and stage 1 through stage 4 chronic kidney disease, or unspecified chronic kidney disease: Secondary | ICD-10-CM | POA: Diagnosis not present

## 2017-04-04 DIAGNOSIS — I482 Chronic atrial fibrillation: Secondary | ICD-10-CM | POA: Diagnosis not present

## 2017-04-04 DIAGNOSIS — N189 Chronic kidney disease, unspecified: Secondary | ICD-10-CM | POA: Diagnosis not present

## 2017-04-04 DIAGNOSIS — I5041 Acute combined systolic (congestive) and diastolic (congestive) heart failure: Secondary | ICD-10-CM | POA: Diagnosis not present

## 2017-04-04 DIAGNOSIS — S81801D Unspecified open wound, right lower leg, subsequent encounter: Secondary | ICD-10-CM | POA: Diagnosis not present

## 2017-04-05 DIAGNOSIS — I5041 Acute combined systolic (congestive) and diastolic (congestive) heart failure: Secondary | ICD-10-CM | POA: Diagnosis not present

## 2017-04-05 DIAGNOSIS — I482 Chronic atrial fibrillation: Secondary | ICD-10-CM | POA: Diagnosis not present

## 2017-04-05 DIAGNOSIS — I130 Hypertensive heart and chronic kidney disease with heart failure and stage 1 through stage 4 chronic kidney disease, or unspecified chronic kidney disease: Secondary | ICD-10-CM | POA: Diagnosis not present

## 2017-04-05 DIAGNOSIS — N189 Chronic kidney disease, unspecified: Secondary | ICD-10-CM | POA: Diagnosis not present

## 2017-04-05 DIAGNOSIS — S81801D Unspecified open wound, right lower leg, subsequent encounter: Secondary | ICD-10-CM | POA: Diagnosis not present

## 2017-04-05 DIAGNOSIS — E1122 Type 2 diabetes mellitus with diabetic chronic kidney disease: Secondary | ICD-10-CM | POA: Diagnosis not present

## 2017-04-06 DIAGNOSIS — S81801D Unspecified open wound, right lower leg, subsequent encounter: Secondary | ICD-10-CM | POA: Diagnosis not present

## 2017-04-06 DIAGNOSIS — Z993 Dependence on wheelchair: Secondary | ICD-10-CM | POA: Diagnosis not present

## 2017-04-06 DIAGNOSIS — Z7984 Long term (current) use of oral hypoglycemic drugs: Secondary | ICD-10-CM | POA: Diagnosis not present

## 2017-04-06 DIAGNOSIS — I5042 Chronic combined systolic (congestive) and diastolic (congestive) heart failure: Secondary | ICD-10-CM | POA: Diagnosis not present

## 2017-04-06 DIAGNOSIS — I130 Hypertensive heart and chronic kidney disease with heart failure and stage 1 through stage 4 chronic kidney disease, or unspecified chronic kidney disease: Secondary | ICD-10-CM | POA: Diagnosis not present

## 2017-04-06 DIAGNOSIS — N189 Chronic kidney disease, unspecified: Secondary | ICD-10-CM | POA: Diagnosis not present

## 2017-04-06 DIAGNOSIS — E1122 Type 2 diabetes mellitus with diabetic chronic kidney disease: Secondary | ICD-10-CM | POA: Diagnosis not present

## 2017-04-06 DIAGNOSIS — Z87891 Personal history of nicotine dependence: Secondary | ICD-10-CM | POA: Diagnosis not present

## 2017-04-06 DIAGNOSIS — I482 Chronic atrial fibrillation: Secondary | ICD-10-CM | POA: Diagnosis not present

## 2017-04-06 DIAGNOSIS — J449 Chronic obstructive pulmonary disease, unspecified: Secondary | ICD-10-CM | POA: Diagnosis not present

## 2017-04-06 DIAGNOSIS — Z7901 Long term (current) use of anticoagulants: Secondary | ICD-10-CM | POA: Diagnosis not present

## 2017-04-06 DIAGNOSIS — Z5181 Encounter for therapeutic drug level monitoring: Secondary | ICD-10-CM | POA: Diagnosis not present

## 2017-04-07 DIAGNOSIS — S81801D Unspecified open wound, right lower leg, subsequent encounter: Secondary | ICD-10-CM | POA: Diagnosis not present

## 2017-04-07 DIAGNOSIS — I5042 Chronic combined systolic (congestive) and diastolic (congestive) heart failure: Secondary | ICD-10-CM | POA: Diagnosis not present

## 2017-04-07 DIAGNOSIS — I130 Hypertensive heart and chronic kidney disease with heart failure and stage 1 through stage 4 chronic kidney disease, or unspecified chronic kidney disease: Secondary | ICD-10-CM | POA: Diagnosis not present

## 2017-04-07 DIAGNOSIS — N189 Chronic kidney disease, unspecified: Secondary | ICD-10-CM | POA: Diagnosis not present

## 2017-04-07 DIAGNOSIS — E1122 Type 2 diabetes mellitus with diabetic chronic kidney disease: Secondary | ICD-10-CM | POA: Diagnosis not present

## 2017-04-07 DIAGNOSIS — J449 Chronic obstructive pulmonary disease, unspecified: Secondary | ICD-10-CM | POA: Diagnosis not present

## 2017-04-08 DIAGNOSIS — J449 Chronic obstructive pulmonary disease, unspecified: Secondary | ICD-10-CM | POA: Diagnosis not present

## 2017-04-08 DIAGNOSIS — E1122 Type 2 diabetes mellitus with diabetic chronic kidney disease: Secondary | ICD-10-CM | POA: Diagnosis not present

## 2017-04-08 DIAGNOSIS — S81801D Unspecified open wound, right lower leg, subsequent encounter: Secondary | ICD-10-CM | POA: Diagnosis not present

## 2017-04-08 DIAGNOSIS — N189 Chronic kidney disease, unspecified: Secondary | ICD-10-CM | POA: Diagnosis not present

## 2017-04-08 DIAGNOSIS — I130 Hypertensive heart and chronic kidney disease with heart failure and stage 1 through stage 4 chronic kidney disease, or unspecified chronic kidney disease: Secondary | ICD-10-CM | POA: Diagnosis not present

## 2017-04-08 DIAGNOSIS — I5042 Chronic combined systolic (congestive) and diastolic (congestive) heart failure: Secondary | ICD-10-CM | POA: Diagnosis not present

## 2017-04-09 DIAGNOSIS — E1122 Type 2 diabetes mellitus with diabetic chronic kidney disease: Secondary | ICD-10-CM | POA: Diagnosis not present

## 2017-04-09 DIAGNOSIS — S81801D Unspecified open wound, right lower leg, subsequent encounter: Secondary | ICD-10-CM | POA: Diagnosis not present

## 2017-04-09 DIAGNOSIS — J449 Chronic obstructive pulmonary disease, unspecified: Secondary | ICD-10-CM | POA: Diagnosis not present

## 2017-04-09 DIAGNOSIS — I130 Hypertensive heart and chronic kidney disease with heart failure and stage 1 through stage 4 chronic kidney disease, or unspecified chronic kidney disease: Secondary | ICD-10-CM | POA: Diagnosis not present

## 2017-04-09 DIAGNOSIS — N189 Chronic kidney disease, unspecified: Secondary | ICD-10-CM | POA: Diagnosis not present

## 2017-04-09 DIAGNOSIS — I5042 Chronic combined systolic (congestive) and diastolic (congestive) heart failure: Secondary | ICD-10-CM | POA: Diagnosis not present

## 2017-04-10 DIAGNOSIS — J449 Chronic obstructive pulmonary disease, unspecified: Secondary | ICD-10-CM | POA: Diagnosis not present

## 2017-04-10 DIAGNOSIS — E1122 Type 2 diabetes mellitus with diabetic chronic kidney disease: Secondary | ICD-10-CM | POA: Diagnosis not present

## 2017-04-10 DIAGNOSIS — S81801D Unspecified open wound, right lower leg, subsequent encounter: Secondary | ICD-10-CM | POA: Diagnosis not present

## 2017-04-10 DIAGNOSIS — I130 Hypertensive heart and chronic kidney disease with heart failure and stage 1 through stage 4 chronic kidney disease, or unspecified chronic kidney disease: Secondary | ICD-10-CM | POA: Diagnosis not present

## 2017-04-10 DIAGNOSIS — I5042 Chronic combined systolic (congestive) and diastolic (congestive) heart failure: Secondary | ICD-10-CM | POA: Diagnosis not present

## 2017-04-10 DIAGNOSIS — N189 Chronic kidney disease, unspecified: Secondary | ICD-10-CM | POA: Diagnosis not present

## 2017-04-11 DIAGNOSIS — N189 Chronic kidney disease, unspecified: Secondary | ICD-10-CM | POA: Diagnosis not present

## 2017-04-11 DIAGNOSIS — I5042 Chronic combined systolic (congestive) and diastolic (congestive) heart failure: Secondary | ICD-10-CM | POA: Diagnosis not present

## 2017-04-11 DIAGNOSIS — S81801D Unspecified open wound, right lower leg, subsequent encounter: Secondary | ICD-10-CM | POA: Diagnosis not present

## 2017-04-11 DIAGNOSIS — I130 Hypertensive heart and chronic kidney disease with heart failure and stage 1 through stage 4 chronic kidney disease, or unspecified chronic kidney disease: Secondary | ICD-10-CM | POA: Diagnosis not present

## 2017-04-11 DIAGNOSIS — J449 Chronic obstructive pulmonary disease, unspecified: Secondary | ICD-10-CM | POA: Diagnosis not present

## 2017-04-11 DIAGNOSIS — E1122 Type 2 diabetes mellitus with diabetic chronic kidney disease: Secondary | ICD-10-CM | POA: Diagnosis not present

## 2017-04-12 DIAGNOSIS — S81801D Unspecified open wound, right lower leg, subsequent encounter: Secondary | ICD-10-CM | POA: Diagnosis not present

## 2017-04-12 DIAGNOSIS — I5042 Chronic combined systolic (congestive) and diastolic (congestive) heart failure: Secondary | ICD-10-CM | POA: Diagnosis not present

## 2017-04-12 DIAGNOSIS — I130 Hypertensive heart and chronic kidney disease with heart failure and stage 1 through stage 4 chronic kidney disease, or unspecified chronic kidney disease: Secondary | ICD-10-CM | POA: Diagnosis not present

## 2017-04-12 DIAGNOSIS — J449 Chronic obstructive pulmonary disease, unspecified: Secondary | ICD-10-CM | POA: Diagnosis not present

## 2017-04-12 DIAGNOSIS — E1122 Type 2 diabetes mellitus with diabetic chronic kidney disease: Secondary | ICD-10-CM | POA: Diagnosis not present

## 2017-04-12 DIAGNOSIS — N189 Chronic kidney disease, unspecified: Secondary | ICD-10-CM | POA: Diagnosis not present

## 2017-04-13 DIAGNOSIS — I130 Hypertensive heart and chronic kidney disease with heart failure and stage 1 through stage 4 chronic kidney disease, or unspecified chronic kidney disease: Secondary | ICD-10-CM | POA: Diagnosis not present

## 2017-04-13 DIAGNOSIS — N189 Chronic kidney disease, unspecified: Secondary | ICD-10-CM | POA: Diagnosis not present

## 2017-04-13 DIAGNOSIS — J449 Chronic obstructive pulmonary disease, unspecified: Secondary | ICD-10-CM | POA: Diagnosis not present

## 2017-04-13 DIAGNOSIS — I5042 Chronic combined systolic (congestive) and diastolic (congestive) heart failure: Secondary | ICD-10-CM | POA: Diagnosis not present

## 2017-04-13 DIAGNOSIS — S81801D Unspecified open wound, right lower leg, subsequent encounter: Secondary | ICD-10-CM | POA: Diagnosis not present

## 2017-04-13 DIAGNOSIS — E1122 Type 2 diabetes mellitus with diabetic chronic kidney disease: Secondary | ICD-10-CM | POA: Diagnosis not present

## 2017-04-14 DIAGNOSIS — S81801D Unspecified open wound, right lower leg, subsequent encounter: Secondary | ICD-10-CM | POA: Diagnosis not present

## 2017-04-14 DIAGNOSIS — J449 Chronic obstructive pulmonary disease, unspecified: Secondary | ICD-10-CM | POA: Diagnosis not present

## 2017-04-14 DIAGNOSIS — I130 Hypertensive heart and chronic kidney disease with heart failure and stage 1 through stage 4 chronic kidney disease, or unspecified chronic kidney disease: Secondary | ICD-10-CM | POA: Diagnosis not present

## 2017-04-14 DIAGNOSIS — I5042 Chronic combined systolic (congestive) and diastolic (congestive) heart failure: Secondary | ICD-10-CM | POA: Diagnosis not present

## 2017-04-14 DIAGNOSIS — N189 Chronic kidney disease, unspecified: Secondary | ICD-10-CM | POA: Diagnosis not present

## 2017-04-14 DIAGNOSIS — E1122 Type 2 diabetes mellitus with diabetic chronic kidney disease: Secondary | ICD-10-CM | POA: Diagnosis not present

## 2017-04-15 DIAGNOSIS — E1122 Type 2 diabetes mellitus with diabetic chronic kidney disease: Secondary | ICD-10-CM | POA: Diagnosis not present

## 2017-04-15 DIAGNOSIS — I5042 Chronic combined systolic (congestive) and diastolic (congestive) heart failure: Secondary | ICD-10-CM | POA: Diagnosis not present

## 2017-04-15 DIAGNOSIS — S81801D Unspecified open wound, right lower leg, subsequent encounter: Secondary | ICD-10-CM | POA: Diagnosis not present

## 2017-04-15 DIAGNOSIS — J449 Chronic obstructive pulmonary disease, unspecified: Secondary | ICD-10-CM | POA: Diagnosis not present

## 2017-04-15 DIAGNOSIS — N189 Chronic kidney disease, unspecified: Secondary | ICD-10-CM | POA: Diagnosis not present

## 2017-04-15 DIAGNOSIS — I130 Hypertensive heart and chronic kidney disease with heart failure and stage 1 through stage 4 chronic kidney disease, or unspecified chronic kidney disease: Secondary | ICD-10-CM | POA: Diagnosis not present

## 2017-04-16 ENCOUNTER — Other Ambulatory Visit (HOSPITAL_COMMUNITY)
Admission: RE | Admit: 2017-04-16 | Discharge: 2017-04-16 | Disposition: A | Discharge status: Home / Self Care | Payer: Medicare Other | Source: Other Acute Inpatient Hospital | Attending: Internal Medicine | Admitting: Internal Medicine

## 2017-04-16 DIAGNOSIS — J449 Chronic obstructive pulmonary disease, unspecified: Secondary | ICD-10-CM | POA: Diagnosis not present

## 2017-04-16 DIAGNOSIS — I5042 Chronic combined systolic (congestive) and diastolic (congestive) heart failure: Secondary | ICD-10-CM | POA: Diagnosis not present

## 2017-04-16 DIAGNOSIS — S81801D Unspecified open wound, right lower leg, subsequent encounter: Secondary | ICD-10-CM | POA: Diagnosis not present

## 2017-04-16 DIAGNOSIS — N189 Chronic kidney disease, unspecified: Secondary | ICD-10-CM | POA: Diagnosis not present

## 2017-04-16 DIAGNOSIS — Z7901 Long term (current) use of anticoagulants: Secondary | ICD-10-CM | POA: Diagnosis not present

## 2017-04-16 DIAGNOSIS — E1122 Type 2 diabetes mellitus with diabetic chronic kidney disease: Secondary | ICD-10-CM | POA: Diagnosis not present

## 2017-04-16 DIAGNOSIS — I130 Hypertensive heart and chronic kidney disease with heart failure and stage 1 through stage 4 chronic kidney disease, or unspecified chronic kidney disease: Secondary | ICD-10-CM | POA: Diagnosis not present

## 2017-04-16 LAB — PROTIME-INR
INR: 2.03
Prothrombin Time: 23.3 seconds — ABNORMAL HIGH (ref 11.4–15.2)

## 2017-04-17 DIAGNOSIS — S81801D Unspecified open wound, right lower leg, subsequent encounter: Secondary | ICD-10-CM | POA: Diagnosis not present

## 2017-04-17 DIAGNOSIS — N189 Chronic kidney disease, unspecified: Secondary | ICD-10-CM | POA: Diagnosis not present

## 2017-04-17 DIAGNOSIS — J449 Chronic obstructive pulmonary disease, unspecified: Secondary | ICD-10-CM | POA: Diagnosis not present

## 2017-04-17 DIAGNOSIS — I5042 Chronic combined systolic (congestive) and diastolic (congestive) heart failure: Secondary | ICD-10-CM | POA: Diagnosis not present

## 2017-04-17 DIAGNOSIS — I130 Hypertensive heart and chronic kidney disease with heart failure and stage 1 through stage 4 chronic kidney disease, or unspecified chronic kidney disease: Secondary | ICD-10-CM | POA: Diagnosis not present

## 2017-04-17 DIAGNOSIS — E1122 Type 2 diabetes mellitus with diabetic chronic kidney disease: Secondary | ICD-10-CM | POA: Diagnosis not present

## 2017-04-18 DIAGNOSIS — S81801D Unspecified open wound, right lower leg, subsequent encounter: Secondary | ICD-10-CM | POA: Diagnosis not present

## 2017-04-18 DIAGNOSIS — N189 Chronic kidney disease, unspecified: Secondary | ICD-10-CM | POA: Diagnosis not present

## 2017-04-18 DIAGNOSIS — I130 Hypertensive heart and chronic kidney disease with heart failure and stage 1 through stage 4 chronic kidney disease, or unspecified chronic kidney disease: Secondary | ICD-10-CM | POA: Diagnosis not present

## 2017-04-18 DIAGNOSIS — J449 Chronic obstructive pulmonary disease, unspecified: Secondary | ICD-10-CM | POA: Diagnosis not present

## 2017-04-18 DIAGNOSIS — E1122 Type 2 diabetes mellitus with diabetic chronic kidney disease: Secondary | ICD-10-CM | POA: Diagnosis not present

## 2017-04-18 DIAGNOSIS — I5042 Chronic combined systolic (congestive) and diastolic (congestive) heart failure: Secondary | ICD-10-CM | POA: Diagnosis not present

## 2017-04-19 DIAGNOSIS — J449 Chronic obstructive pulmonary disease, unspecified: Secondary | ICD-10-CM | POA: Diagnosis not present

## 2017-04-19 DIAGNOSIS — N189 Chronic kidney disease, unspecified: Secondary | ICD-10-CM | POA: Diagnosis not present

## 2017-04-19 DIAGNOSIS — I130 Hypertensive heart and chronic kidney disease with heart failure and stage 1 through stage 4 chronic kidney disease, or unspecified chronic kidney disease: Secondary | ICD-10-CM | POA: Diagnosis not present

## 2017-04-19 DIAGNOSIS — S81801D Unspecified open wound, right lower leg, subsequent encounter: Secondary | ICD-10-CM | POA: Diagnosis not present

## 2017-04-19 DIAGNOSIS — I5042 Chronic combined systolic (congestive) and diastolic (congestive) heart failure: Secondary | ICD-10-CM | POA: Diagnosis not present

## 2017-04-19 DIAGNOSIS — E1122 Type 2 diabetes mellitus with diabetic chronic kidney disease: Secondary | ICD-10-CM | POA: Diagnosis not present

## 2017-04-20 DIAGNOSIS — I5042 Chronic combined systolic (congestive) and diastolic (congestive) heart failure: Secondary | ICD-10-CM | POA: Diagnosis not present

## 2017-04-20 DIAGNOSIS — N189 Chronic kidney disease, unspecified: Secondary | ICD-10-CM | POA: Diagnosis not present

## 2017-04-20 DIAGNOSIS — I130 Hypertensive heart and chronic kidney disease with heart failure and stage 1 through stage 4 chronic kidney disease, or unspecified chronic kidney disease: Secondary | ICD-10-CM | POA: Diagnosis not present

## 2017-04-20 DIAGNOSIS — S81801D Unspecified open wound, right lower leg, subsequent encounter: Secondary | ICD-10-CM | POA: Diagnosis not present

## 2017-04-20 DIAGNOSIS — J449 Chronic obstructive pulmonary disease, unspecified: Secondary | ICD-10-CM | POA: Diagnosis not present

## 2017-04-20 DIAGNOSIS — E1122 Type 2 diabetes mellitus with diabetic chronic kidney disease: Secondary | ICD-10-CM | POA: Diagnosis not present

## 2017-04-21 DIAGNOSIS — N189 Chronic kidney disease, unspecified: Secondary | ICD-10-CM | POA: Diagnosis not present

## 2017-04-21 DIAGNOSIS — E1122 Type 2 diabetes mellitus with diabetic chronic kidney disease: Secondary | ICD-10-CM | POA: Diagnosis not present

## 2017-04-21 DIAGNOSIS — S81801D Unspecified open wound, right lower leg, subsequent encounter: Secondary | ICD-10-CM | POA: Diagnosis not present

## 2017-04-21 DIAGNOSIS — J449 Chronic obstructive pulmonary disease, unspecified: Secondary | ICD-10-CM | POA: Diagnosis not present

## 2017-04-21 DIAGNOSIS — I130 Hypertensive heart and chronic kidney disease with heart failure and stage 1 through stage 4 chronic kidney disease, or unspecified chronic kidney disease: Secondary | ICD-10-CM | POA: Diagnosis not present

## 2017-04-21 DIAGNOSIS — I5042 Chronic combined systolic (congestive) and diastolic (congestive) heart failure: Secondary | ICD-10-CM | POA: Diagnosis not present

## 2017-04-22 DIAGNOSIS — J449 Chronic obstructive pulmonary disease, unspecified: Secondary | ICD-10-CM | POA: Diagnosis not present

## 2017-04-22 DIAGNOSIS — S81801D Unspecified open wound, right lower leg, subsequent encounter: Secondary | ICD-10-CM | POA: Diagnosis not present

## 2017-04-22 DIAGNOSIS — N189 Chronic kidney disease, unspecified: Secondary | ICD-10-CM | POA: Diagnosis not present

## 2017-04-22 DIAGNOSIS — I5042 Chronic combined systolic (congestive) and diastolic (congestive) heart failure: Secondary | ICD-10-CM | POA: Diagnosis not present

## 2017-04-22 DIAGNOSIS — E1122 Type 2 diabetes mellitus with diabetic chronic kidney disease: Secondary | ICD-10-CM | POA: Diagnosis not present

## 2017-04-22 DIAGNOSIS — I130 Hypertensive heart and chronic kidney disease with heart failure and stage 1 through stage 4 chronic kidney disease, or unspecified chronic kidney disease: Secondary | ICD-10-CM | POA: Diagnosis not present

## 2017-04-23 DIAGNOSIS — E1122 Type 2 diabetes mellitus with diabetic chronic kidney disease: Secondary | ICD-10-CM | POA: Diagnosis not present

## 2017-04-23 DIAGNOSIS — N189 Chronic kidney disease, unspecified: Secondary | ICD-10-CM | POA: Diagnosis not present

## 2017-04-23 DIAGNOSIS — S81801D Unspecified open wound, right lower leg, subsequent encounter: Secondary | ICD-10-CM | POA: Diagnosis not present

## 2017-04-23 DIAGNOSIS — I130 Hypertensive heart and chronic kidney disease with heart failure and stage 1 through stage 4 chronic kidney disease, or unspecified chronic kidney disease: Secondary | ICD-10-CM | POA: Diagnosis not present

## 2017-04-23 DIAGNOSIS — I5042 Chronic combined systolic (congestive) and diastolic (congestive) heart failure: Secondary | ICD-10-CM | POA: Diagnosis not present

## 2017-04-23 DIAGNOSIS — J449 Chronic obstructive pulmonary disease, unspecified: Secondary | ICD-10-CM | POA: Diagnosis not present

## 2017-04-24 DIAGNOSIS — I130 Hypertensive heart and chronic kidney disease with heart failure and stage 1 through stage 4 chronic kidney disease, or unspecified chronic kidney disease: Secondary | ICD-10-CM | POA: Diagnosis not present

## 2017-04-24 DIAGNOSIS — N189 Chronic kidney disease, unspecified: Secondary | ICD-10-CM | POA: Diagnosis not present

## 2017-04-24 DIAGNOSIS — E1122 Type 2 diabetes mellitus with diabetic chronic kidney disease: Secondary | ICD-10-CM | POA: Diagnosis not present

## 2017-04-24 DIAGNOSIS — S81801D Unspecified open wound, right lower leg, subsequent encounter: Secondary | ICD-10-CM | POA: Diagnosis not present

## 2017-04-24 DIAGNOSIS — J449 Chronic obstructive pulmonary disease, unspecified: Secondary | ICD-10-CM | POA: Diagnosis not present

## 2017-04-24 DIAGNOSIS — I5042 Chronic combined systolic (congestive) and diastolic (congestive) heart failure: Secondary | ICD-10-CM | POA: Diagnosis not present

## 2017-04-25 DIAGNOSIS — I130 Hypertensive heart and chronic kidney disease with heart failure and stage 1 through stage 4 chronic kidney disease, or unspecified chronic kidney disease: Secondary | ICD-10-CM | POA: Diagnosis not present

## 2017-04-25 DIAGNOSIS — N189 Chronic kidney disease, unspecified: Secondary | ICD-10-CM | POA: Diagnosis not present

## 2017-04-25 DIAGNOSIS — E1122 Type 2 diabetes mellitus with diabetic chronic kidney disease: Secondary | ICD-10-CM | POA: Diagnosis not present

## 2017-04-25 DIAGNOSIS — J449 Chronic obstructive pulmonary disease, unspecified: Secondary | ICD-10-CM | POA: Diagnosis not present

## 2017-04-25 DIAGNOSIS — S81801D Unspecified open wound, right lower leg, subsequent encounter: Secondary | ICD-10-CM | POA: Diagnosis not present

## 2017-04-25 DIAGNOSIS — I5042 Chronic combined systolic (congestive) and diastolic (congestive) heart failure: Secondary | ICD-10-CM | POA: Diagnosis not present

## 2017-04-26 DIAGNOSIS — J449 Chronic obstructive pulmonary disease, unspecified: Secondary | ICD-10-CM | POA: Diagnosis not present

## 2017-04-26 DIAGNOSIS — I130 Hypertensive heart and chronic kidney disease with heart failure and stage 1 through stage 4 chronic kidney disease, or unspecified chronic kidney disease: Secondary | ICD-10-CM | POA: Diagnosis not present

## 2017-04-26 DIAGNOSIS — N189 Chronic kidney disease, unspecified: Secondary | ICD-10-CM | POA: Diagnosis not present

## 2017-04-26 DIAGNOSIS — E1122 Type 2 diabetes mellitus with diabetic chronic kidney disease: Secondary | ICD-10-CM | POA: Diagnosis not present

## 2017-04-26 DIAGNOSIS — I5042 Chronic combined systolic (congestive) and diastolic (congestive) heart failure: Secondary | ICD-10-CM | POA: Diagnosis not present

## 2017-04-26 DIAGNOSIS — S81801D Unspecified open wound, right lower leg, subsequent encounter: Secondary | ICD-10-CM | POA: Diagnosis not present

## 2017-04-28 DIAGNOSIS — S81801D Unspecified open wound, right lower leg, subsequent encounter: Secondary | ICD-10-CM | POA: Diagnosis not present

## 2017-04-28 DIAGNOSIS — J449 Chronic obstructive pulmonary disease, unspecified: Secondary | ICD-10-CM | POA: Diagnosis not present

## 2017-04-28 DIAGNOSIS — E1122 Type 2 diabetes mellitus with diabetic chronic kidney disease: Secondary | ICD-10-CM | POA: Diagnosis not present

## 2017-04-28 DIAGNOSIS — I5042 Chronic combined systolic (congestive) and diastolic (congestive) heart failure: Secondary | ICD-10-CM | POA: Diagnosis not present

## 2017-04-28 DIAGNOSIS — N189 Chronic kidney disease, unspecified: Secondary | ICD-10-CM | POA: Diagnosis not present

## 2017-04-28 DIAGNOSIS — I130 Hypertensive heart and chronic kidney disease with heart failure and stage 1 through stage 4 chronic kidney disease, or unspecified chronic kidney disease: Secondary | ICD-10-CM | POA: Diagnosis not present

## 2017-04-29 DIAGNOSIS — S81801D Unspecified open wound, right lower leg, subsequent encounter: Secondary | ICD-10-CM | POA: Diagnosis not present

## 2017-04-29 DIAGNOSIS — I130 Hypertensive heart and chronic kidney disease with heart failure and stage 1 through stage 4 chronic kidney disease, or unspecified chronic kidney disease: Secondary | ICD-10-CM | POA: Diagnosis not present

## 2017-04-29 DIAGNOSIS — J449 Chronic obstructive pulmonary disease, unspecified: Secondary | ICD-10-CM | POA: Diagnosis not present

## 2017-04-29 DIAGNOSIS — E1122 Type 2 diabetes mellitus with diabetic chronic kidney disease: Secondary | ICD-10-CM | POA: Diagnosis not present

## 2017-04-29 DIAGNOSIS — I5042 Chronic combined systolic (congestive) and diastolic (congestive) heart failure: Secondary | ICD-10-CM | POA: Diagnosis not present

## 2017-04-29 DIAGNOSIS — N189 Chronic kidney disease, unspecified: Secondary | ICD-10-CM | POA: Diagnosis not present

## 2017-04-30 ENCOUNTER — Other Ambulatory Visit (HOSPITAL_COMMUNITY)
Admission: RE | Admit: 2017-04-30 | Discharge: 2017-04-30 | Disposition: A | Discharge status: Home / Self Care | Payer: Medicare Other | Source: Other Acute Inpatient Hospital | Attending: Internal Medicine | Admitting: Internal Medicine

## 2017-04-30 DIAGNOSIS — E1122 Type 2 diabetes mellitus with diabetic chronic kidney disease: Secondary | ICD-10-CM | POA: Diagnosis not present

## 2017-04-30 DIAGNOSIS — N189 Chronic kidney disease, unspecified: Secondary | ICD-10-CM | POA: Diagnosis not present

## 2017-04-30 DIAGNOSIS — I130 Hypertensive heart and chronic kidney disease with heart failure and stage 1 through stage 4 chronic kidney disease, or unspecified chronic kidney disease: Secondary | ICD-10-CM | POA: Diagnosis not present

## 2017-04-30 DIAGNOSIS — Z7901 Long term (current) use of anticoagulants: Secondary | ICD-10-CM | POA: Diagnosis not present

## 2017-04-30 DIAGNOSIS — I5042 Chronic combined systolic (congestive) and diastolic (congestive) heart failure: Secondary | ICD-10-CM | POA: Diagnosis not present

## 2017-04-30 DIAGNOSIS — S81801D Unspecified open wound, right lower leg, subsequent encounter: Secondary | ICD-10-CM | POA: Diagnosis not present

## 2017-04-30 DIAGNOSIS — J449 Chronic obstructive pulmonary disease, unspecified: Secondary | ICD-10-CM | POA: Diagnosis not present

## 2017-04-30 LAB — PROTIME-INR
INR: 4.33
Prothrombin Time: 42.6 seconds — ABNORMAL HIGH (ref 11.4–15.2)

## 2017-05-01 DIAGNOSIS — N189 Chronic kidney disease, unspecified: Secondary | ICD-10-CM | POA: Diagnosis not present

## 2017-05-01 DIAGNOSIS — S81801D Unspecified open wound, right lower leg, subsequent encounter: Secondary | ICD-10-CM | POA: Diagnosis not present

## 2017-05-01 DIAGNOSIS — I5042 Chronic combined systolic (congestive) and diastolic (congestive) heart failure: Secondary | ICD-10-CM | POA: Diagnosis not present

## 2017-05-01 DIAGNOSIS — E1122 Type 2 diabetes mellitus with diabetic chronic kidney disease: Secondary | ICD-10-CM | POA: Diagnosis not present

## 2017-05-01 DIAGNOSIS — J449 Chronic obstructive pulmonary disease, unspecified: Secondary | ICD-10-CM | POA: Diagnosis not present

## 2017-05-01 DIAGNOSIS — I130 Hypertensive heart and chronic kidney disease with heart failure and stage 1 through stage 4 chronic kidney disease, or unspecified chronic kidney disease: Secondary | ICD-10-CM | POA: Diagnosis not present

## 2017-05-02 DIAGNOSIS — I5042 Chronic combined systolic (congestive) and diastolic (congestive) heart failure: Secondary | ICD-10-CM | POA: Diagnosis not present

## 2017-05-02 DIAGNOSIS — N189 Chronic kidney disease, unspecified: Secondary | ICD-10-CM | POA: Diagnosis not present

## 2017-05-02 DIAGNOSIS — I130 Hypertensive heart and chronic kidney disease with heart failure and stage 1 through stage 4 chronic kidney disease, or unspecified chronic kidney disease: Secondary | ICD-10-CM | POA: Diagnosis not present

## 2017-05-02 DIAGNOSIS — S81801D Unspecified open wound, right lower leg, subsequent encounter: Secondary | ICD-10-CM | POA: Diagnosis not present

## 2017-05-02 DIAGNOSIS — J449 Chronic obstructive pulmonary disease, unspecified: Secondary | ICD-10-CM | POA: Diagnosis not present

## 2017-05-02 DIAGNOSIS — E1122 Type 2 diabetes mellitus with diabetic chronic kidney disease: Secondary | ICD-10-CM | POA: Diagnosis not present

## 2017-05-03 DIAGNOSIS — E1122 Type 2 diabetes mellitus with diabetic chronic kidney disease: Secondary | ICD-10-CM | POA: Diagnosis not present

## 2017-05-03 DIAGNOSIS — J449 Chronic obstructive pulmonary disease, unspecified: Secondary | ICD-10-CM | POA: Diagnosis not present

## 2017-05-03 DIAGNOSIS — N189 Chronic kidney disease, unspecified: Secondary | ICD-10-CM | POA: Diagnosis not present

## 2017-05-03 DIAGNOSIS — I5042 Chronic combined systolic (congestive) and diastolic (congestive) heart failure: Secondary | ICD-10-CM | POA: Diagnosis not present

## 2017-05-03 DIAGNOSIS — S81801D Unspecified open wound, right lower leg, subsequent encounter: Secondary | ICD-10-CM | POA: Diagnosis not present

## 2017-05-03 DIAGNOSIS — I130 Hypertensive heart and chronic kidney disease with heart failure and stage 1 through stage 4 chronic kidney disease, or unspecified chronic kidney disease: Secondary | ICD-10-CM | POA: Diagnosis not present

## 2017-05-04 DIAGNOSIS — J449 Chronic obstructive pulmonary disease, unspecified: Secondary | ICD-10-CM | POA: Diagnosis not present

## 2017-05-04 DIAGNOSIS — E1122 Type 2 diabetes mellitus with diabetic chronic kidney disease: Secondary | ICD-10-CM | POA: Diagnosis not present

## 2017-05-04 DIAGNOSIS — S81801D Unspecified open wound, right lower leg, subsequent encounter: Secondary | ICD-10-CM | POA: Diagnosis not present

## 2017-05-04 DIAGNOSIS — I130 Hypertensive heart and chronic kidney disease with heart failure and stage 1 through stage 4 chronic kidney disease, or unspecified chronic kidney disease: Secondary | ICD-10-CM | POA: Diagnosis not present

## 2017-05-04 DIAGNOSIS — N189 Chronic kidney disease, unspecified: Secondary | ICD-10-CM | POA: Diagnosis not present

## 2017-05-04 DIAGNOSIS — I5042 Chronic combined systolic (congestive) and diastolic (congestive) heart failure: Secondary | ICD-10-CM | POA: Diagnosis not present

## 2017-05-05 DIAGNOSIS — S81801D Unspecified open wound, right lower leg, subsequent encounter: Secondary | ICD-10-CM | POA: Diagnosis not present

## 2017-05-05 DIAGNOSIS — J449 Chronic obstructive pulmonary disease, unspecified: Secondary | ICD-10-CM | POA: Diagnosis not present

## 2017-05-05 DIAGNOSIS — I130 Hypertensive heart and chronic kidney disease with heart failure and stage 1 through stage 4 chronic kidney disease, or unspecified chronic kidney disease: Secondary | ICD-10-CM | POA: Diagnosis not present

## 2017-05-05 DIAGNOSIS — N189 Chronic kidney disease, unspecified: Secondary | ICD-10-CM | POA: Diagnosis not present

## 2017-05-05 DIAGNOSIS — E1122 Type 2 diabetes mellitus with diabetic chronic kidney disease: Secondary | ICD-10-CM | POA: Diagnosis not present

## 2017-05-05 DIAGNOSIS — I5042 Chronic combined systolic (congestive) and diastolic (congestive) heart failure: Secondary | ICD-10-CM | POA: Diagnosis not present

## 2017-05-06 DIAGNOSIS — I130 Hypertensive heart and chronic kidney disease with heart failure and stage 1 through stage 4 chronic kidney disease, or unspecified chronic kidney disease: Secondary | ICD-10-CM | POA: Diagnosis not present

## 2017-05-06 DIAGNOSIS — S81801D Unspecified open wound, right lower leg, subsequent encounter: Secondary | ICD-10-CM | POA: Diagnosis not present

## 2017-05-06 DIAGNOSIS — E1122 Type 2 diabetes mellitus with diabetic chronic kidney disease: Secondary | ICD-10-CM | POA: Diagnosis not present

## 2017-05-06 DIAGNOSIS — N189 Chronic kidney disease, unspecified: Secondary | ICD-10-CM | POA: Diagnosis not present

## 2017-05-06 DIAGNOSIS — I5042 Chronic combined systolic (congestive) and diastolic (congestive) heart failure: Secondary | ICD-10-CM | POA: Diagnosis not present

## 2017-05-06 DIAGNOSIS — J449 Chronic obstructive pulmonary disease, unspecified: Secondary | ICD-10-CM | POA: Diagnosis not present

## 2017-05-07 ENCOUNTER — Other Ambulatory Visit (HOSPITAL_COMMUNITY)
Admission: RE | Admit: 2017-05-07 | Discharge: 2017-05-07 | Disposition: A | Discharge status: Home / Self Care | Payer: Medicare Other | Source: Other Acute Inpatient Hospital | Attending: Internal Medicine | Admitting: Internal Medicine

## 2017-05-07 DIAGNOSIS — Z7901 Long term (current) use of anticoagulants: Secondary | ICD-10-CM | POA: Insufficient documentation

## 2017-05-07 LAB — PROTIME-INR
INR: 1.56
Prothrombin Time: 18.8 seconds — ABNORMAL HIGH (ref 11.4–15.2)

## 2017-05-08 DIAGNOSIS — I130 Hypertensive heart and chronic kidney disease with heart failure and stage 1 through stage 4 chronic kidney disease, or unspecified chronic kidney disease: Secondary | ICD-10-CM | POA: Diagnosis not present

## 2017-05-08 DIAGNOSIS — J449 Chronic obstructive pulmonary disease, unspecified: Secondary | ICD-10-CM | POA: Diagnosis not present

## 2017-05-08 DIAGNOSIS — E1122 Type 2 diabetes mellitus with diabetic chronic kidney disease: Secondary | ICD-10-CM | POA: Diagnosis not present

## 2017-05-08 DIAGNOSIS — I5042 Chronic combined systolic (congestive) and diastolic (congestive) heart failure: Secondary | ICD-10-CM | POA: Diagnosis not present

## 2017-05-08 DIAGNOSIS — N189 Chronic kidney disease, unspecified: Secondary | ICD-10-CM | POA: Diagnosis not present

## 2017-05-08 DIAGNOSIS — S81801D Unspecified open wound, right lower leg, subsequent encounter: Secondary | ICD-10-CM | POA: Diagnosis not present

## 2017-05-09 DIAGNOSIS — I5042 Chronic combined systolic (congestive) and diastolic (congestive) heart failure: Secondary | ICD-10-CM | POA: Diagnosis not present

## 2017-05-09 DIAGNOSIS — J449 Chronic obstructive pulmonary disease, unspecified: Secondary | ICD-10-CM | POA: Diagnosis not present

## 2017-05-09 DIAGNOSIS — N189 Chronic kidney disease, unspecified: Secondary | ICD-10-CM | POA: Diagnosis not present

## 2017-05-09 DIAGNOSIS — S81801D Unspecified open wound, right lower leg, subsequent encounter: Secondary | ICD-10-CM | POA: Diagnosis not present

## 2017-05-09 DIAGNOSIS — I130 Hypertensive heart and chronic kidney disease with heart failure and stage 1 through stage 4 chronic kidney disease, or unspecified chronic kidney disease: Secondary | ICD-10-CM | POA: Diagnosis not present

## 2017-05-09 DIAGNOSIS — E1122 Type 2 diabetes mellitus with diabetic chronic kidney disease: Secondary | ICD-10-CM | POA: Diagnosis not present

## 2017-05-10 DIAGNOSIS — N189 Chronic kidney disease, unspecified: Secondary | ICD-10-CM | POA: Diagnosis not present

## 2017-05-10 DIAGNOSIS — I5042 Chronic combined systolic (congestive) and diastolic (congestive) heart failure: Secondary | ICD-10-CM | POA: Diagnosis not present

## 2017-05-10 DIAGNOSIS — S81801D Unspecified open wound, right lower leg, subsequent encounter: Secondary | ICD-10-CM | POA: Diagnosis not present

## 2017-05-10 DIAGNOSIS — J449 Chronic obstructive pulmonary disease, unspecified: Secondary | ICD-10-CM | POA: Diagnosis not present

## 2017-05-10 DIAGNOSIS — E1122 Type 2 diabetes mellitus with diabetic chronic kidney disease: Secondary | ICD-10-CM | POA: Diagnosis not present

## 2017-05-10 DIAGNOSIS — I130 Hypertensive heart and chronic kidney disease with heart failure and stage 1 through stage 4 chronic kidney disease, or unspecified chronic kidney disease: Secondary | ICD-10-CM | POA: Diagnosis not present

## 2017-05-11 DIAGNOSIS — I130 Hypertensive heart and chronic kidney disease with heart failure and stage 1 through stage 4 chronic kidney disease, or unspecified chronic kidney disease: Secondary | ICD-10-CM | POA: Diagnosis not present

## 2017-05-11 DIAGNOSIS — E1122 Type 2 diabetes mellitus with diabetic chronic kidney disease: Secondary | ICD-10-CM | POA: Diagnosis not present

## 2017-05-11 DIAGNOSIS — N189 Chronic kidney disease, unspecified: Secondary | ICD-10-CM | POA: Diagnosis not present

## 2017-05-11 DIAGNOSIS — S81801D Unspecified open wound, right lower leg, subsequent encounter: Secondary | ICD-10-CM | POA: Diagnosis not present

## 2017-05-11 DIAGNOSIS — I5042 Chronic combined systolic (congestive) and diastolic (congestive) heart failure: Secondary | ICD-10-CM | POA: Diagnosis not present

## 2017-05-11 DIAGNOSIS — J449 Chronic obstructive pulmonary disease, unspecified: Secondary | ICD-10-CM | POA: Diagnosis not present

## 2017-05-12 DIAGNOSIS — I5042 Chronic combined systolic (congestive) and diastolic (congestive) heart failure: Secondary | ICD-10-CM | POA: Diagnosis not present

## 2017-05-12 DIAGNOSIS — N189 Chronic kidney disease, unspecified: Secondary | ICD-10-CM | POA: Diagnosis not present

## 2017-05-12 DIAGNOSIS — S81801D Unspecified open wound, right lower leg, subsequent encounter: Secondary | ICD-10-CM | POA: Diagnosis not present

## 2017-05-12 DIAGNOSIS — I130 Hypertensive heart and chronic kidney disease with heart failure and stage 1 through stage 4 chronic kidney disease, or unspecified chronic kidney disease: Secondary | ICD-10-CM | POA: Diagnosis not present

## 2017-05-12 DIAGNOSIS — E1122 Type 2 diabetes mellitus with diabetic chronic kidney disease: Secondary | ICD-10-CM | POA: Diagnosis not present

## 2017-05-12 DIAGNOSIS — J449 Chronic obstructive pulmonary disease, unspecified: Secondary | ICD-10-CM | POA: Diagnosis not present

## 2017-05-13 DIAGNOSIS — Z89411 Acquired absence of right great toe: Secondary | ICD-10-CM | POA: Diagnosis not present

## 2017-05-13 DIAGNOSIS — Z87891 Personal history of nicotine dependence: Secondary | ICD-10-CM | POA: Diagnosis not present

## 2017-05-13 DIAGNOSIS — Z89421 Acquired absence of other right toe(s): Secondary | ICD-10-CM | POA: Diagnosis not present

## 2017-05-13 DIAGNOSIS — Z7901 Long term (current) use of anticoagulants: Secondary | ICD-10-CM | POA: Diagnosis not present

## 2017-05-13 DIAGNOSIS — Z79899 Other long term (current) drug therapy: Secondary | ICD-10-CM | POA: Diagnosis not present

## 2017-05-13 DIAGNOSIS — Z888 Allergy status to other drugs, medicaments and biological substances status: Secondary | ICD-10-CM | POA: Diagnosis not present

## 2017-05-13 DIAGNOSIS — F419 Anxiety disorder, unspecified: Secondary | ICD-10-CM | POA: Diagnosis not present

## 2017-05-13 DIAGNOSIS — Z8249 Family history of ischemic heart disease and other diseases of the circulatory system: Secondary | ICD-10-CM | POA: Diagnosis not present

## 2017-05-13 DIAGNOSIS — S81801A Unspecified open wound, right lower leg, initial encounter: Secondary | ICD-10-CM | POA: Diagnosis not present

## 2017-05-13 DIAGNOSIS — Z7984 Long term (current) use of oral hypoglycemic drugs: Secondary | ICD-10-CM | POA: Diagnosis not present

## 2017-05-13 DIAGNOSIS — I482 Chronic atrial fibrillation: Secondary | ICD-10-CM | POA: Diagnosis not present

## 2017-05-13 DIAGNOSIS — K219 Gastro-esophageal reflux disease without esophagitis: Secondary | ICD-10-CM | POA: Diagnosis not present

## 2017-05-13 DIAGNOSIS — N189 Chronic kidney disease, unspecified: Secondary | ICD-10-CM | POA: Diagnosis not present

## 2017-05-13 DIAGNOSIS — Z85828 Personal history of other malignant neoplasm of skin: Secondary | ICD-10-CM | POA: Diagnosis not present

## 2017-05-13 DIAGNOSIS — I130 Hypertensive heart and chronic kidney disease with heart failure and stage 1 through stage 4 chronic kidney disease, or unspecified chronic kidney disease: Secondary | ICD-10-CM | POA: Diagnosis not present

## 2017-05-13 DIAGNOSIS — Z886 Allergy status to analgesic agent status: Secondary | ICD-10-CM | POA: Diagnosis not present

## 2017-05-13 DIAGNOSIS — I5042 Chronic combined systolic (congestive) and diastolic (congestive) heart failure: Secondary | ICD-10-CM | POA: Diagnosis not present

## 2017-05-13 DIAGNOSIS — J449 Chronic obstructive pulmonary disease, unspecified: Secondary | ICD-10-CM | POA: Diagnosis not present

## 2017-05-13 DIAGNOSIS — S81801D Unspecified open wound, right lower leg, subsequent encounter: Secondary | ICD-10-CM | POA: Diagnosis not present

## 2017-05-13 DIAGNOSIS — M109 Gout, unspecified: Secondary | ICD-10-CM | POA: Diagnosis not present

## 2017-05-13 DIAGNOSIS — E785 Hyperlipidemia, unspecified: Secondary | ICD-10-CM | POA: Diagnosis not present

## 2017-05-13 DIAGNOSIS — E1122 Type 2 diabetes mellitus with diabetic chronic kidney disease: Secondary | ICD-10-CM | POA: Diagnosis not present

## 2017-05-14 DIAGNOSIS — I130 Hypertensive heart and chronic kidney disease with heart failure and stage 1 through stage 4 chronic kidney disease, or unspecified chronic kidney disease: Secondary | ICD-10-CM | POA: Diagnosis not present

## 2017-05-14 DIAGNOSIS — I5042 Chronic combined systolic (congestive) and diastolic (congestive) heart failure: Secondary | ICD-10-CM | POA: Diagnosis not present

## 2017-05-14 DIAGNOSIS — E1122 Type 2 diabetes mellitus with diabetic chronic kidney disease: Secondary | ICD-10-CM | POA: Diagnosis not present

## 2017-05-14 DIAGNOSIS — N189 Chronic kidney disease, unspecified: Secondary | ICD-10-CM | POA: Diagnosis not present

## 2017-05-14 DIAGNOSIS — Z85828 Personal history of other malignant neoplasm of skin: Secondary | ICD-10-CM | POA: Diagnosis not present

## 2017-05-14 DIAGNOSIS — S81801A Unspecified open wound, right lower leg, initial encounter: Secondary | ICD-10-CM | POA: Diagnosis not present

## 2017-05-15 DIAGNOSIS — S81811A Laceration without foreign body, right lower leg, initial encounter: Secondary | ICD-10-CM | POA: Diagnosis not present

## 2017-05-15 DIAGNOSIS — I5042 Chronic combined systolic (congestive) and diastolic (congestive) heart failure: Secondary | ICD-10-CM | POA: Diagnosis not present

## 2017-05-15 DIAGNOSIS — E1122 Type 2 diabetes mellitus with diabetic chronic kidney disease: Secondary | ICD-10-CM | POA: Diagnosis not present

## 2017-05-15 DIAGNOSIS — N183 Chronic kidney disease, stage 3 (moderate): Secondary | ICD-10-CM | POA: Diagnosis not present

## 2017-05-18 DIAGNOSIS — R339 Retention of urine, unspecified: Secondary | ICD-10-CM | POA: Diagnosis not present

## 2017-05-18 DIAGNOSIS — Z7901 Long term (current) use of anticoagulants: Secondary | ICD-10-CM | POA: Diagnosis not present

## 2017-05-18 DIAGNOSIS — R278 Other lack of coordination: Secondary | ICD-10-CM | POA: Diagnosis not present

## 2017-05-18 DIAGNOSIS — E119 Type 2 diabetes mellitus without complications: Secondary | ICD-10-CM | POA: Diagnosis not present

## 2017-05-18 DIAGNOSIS — K922 Gastrointestinal hemorrhage, unspecified: Secondary | ICD-10-CM | POA: Diagnosis not present

## 2017-05-18 DIAGNOSIS — J168 Pneumonia due to other specified infectious organisms: Secondary | ICD-10-CM | POA: Diagnosis not present

## 2017-05-18 DIAGNOSIS — Z79891 Long term (current) use of opiate analgesic: Secondary | ICD-10-CM | POA: Diagnosis not present

## 2017-05-18 DIAGNOSIS — Z87891 Personal history of nicotine dependence: Secondary | ICD-10-CM | POA: Diagnosis not present

## 2017-05-18 DIAGNOSIS — E440 Moderate protein-calorie malnutrition: Secondary | ICD-10-CM | POA: Diagnosis present

## 2017-05-18 DIAGNOSIS — R0602 Shortness of breath: Secondary | ICD-10-CM | POA: Diagnosis not present

## 2017-05-18 DIAGNOSIS — R069 Unspecified abnormalities of breathing: Secondary | ICD-10-CM | POA: Diagnosis not present

## 2017-05-18 DIAGNOSIS — I482 Chronic atrial fibrillation: Secondary | ICD-10-CM | POA: Diagnosis present

## 2017-05-18 DIAGNOSIS — J189 Pneumonia, unspecified organism: Secondary | ICD-10-CM | POA: Diagnosis not present

## 2017-05-18 DIAGNOSIS — R2689 Other abnormalities of gait and mobility: Secondary | ICD-10-CM | POA: Diagnosis not present

## 2017-05-18 DIAGNOSIS — D638 Anemia in other chronic diseases classified elsewhere: Secondary | ICD-10-CM | POA: Diagnosis present

## 2017-05-18 DIAGNOSIS — J449 Chronic obstructive pulmonary disease, unspecified: Secondary | ICD-10-CM | POA: Diagnosis present

## 2017-05-18 DIAGNOSIS — I5041 Acute combined systolic (congestive) and diastolic (congestive) heart failure: Secondary | ICD-10-CM | POA: Diagnosis not present

## 2017-05-18 DIAGNOSIS — D649 Anemia, unspecified: Secondary | ICD-10-CM | POA: Diagnosis present

## 2017-05-18 DIAGNOSIS — M109 Gout, unspecified: Secondary | ICD-10-CM | POA: Diagnosis present

## 2017-05-18 DIAGNOSIS — K2961 Other gastritis with bleeding: Secondary | ICD-10-CM | POA: Diagnosis not present

## 2017-05-18 DIAGNOSIS — I214 Non-ST elevation (NSTEMI) myocardial infarction: Secondary | ICD-10-CM | POA: Diagnosis not present

## 2017-05-18 DIAGNOSIS — J9691 Respiratory failure, unspecified with hypoxia: Secondary | ICD-10-CM | POA: Diagnosis not present

## 2017-05-18 DIAGNOSIS — Z7984 Long term (current) use of oral hypoglycemic drugs: Secondary | ICD-10-CM | POA: Diagnosis not present

## 2017-05-18 DIAGNOSIS — K2971 Gastritis, unspecified, with bleeding: Secondary | ICD-10-CM | POA: Diagnosis not present

## 2017-05-18 DIAGNOSIS — Z6823 Body mass index (BMI) 23.0-23.9, adult: Secondary | ICD-10-CM | POA: Diagnosis not present

## 2017-05-18 DIAGNOSIS — E785 Hyperlipidemia, unspecified: Secondary | ICD-10-CM | POA: Diagnosis present

## 2017-05-18 DIAGNOSIS — K219 Gastro-esophageal reflux disease without esophagitis: Secondary | ICD-10-CM | POA: Diagnosis present

## 2017-05-18 DIAGNOSIS — Z886 Allergy status to analgesic agent status: Secondary | ICD-10-CM | POA: Diagnosis not present

## 2017-05-18 DIAGNOSIS — Z79899 Other long term (current) drug therapy: Secondary | ICD-10-CM | POA: Diagnosis not present

## 2017-05-18 DIAGNOSIS — I110 Hypertensive heart disease with heart failure: Secondary | ICD-10-CM | POA: Diagnosis present

## 2017-05-18 DIAGNOSIS — D62 Acute posthemorrhagic anemia: Secondary | ICD-10-CM | POA: Diagnosis not present

## 2017-05-18 DIAGNOSIS — Z888 Allergy status to other drugs, medicaments and biological substances status: Secondary | ICD-10-CM | POA: Diagnosis not present

## 2017-05-18 DIAGNOSIS — E7800 Pure hypercholesterolemia, unspecified: Secondary | ICD-10-CM | POA: Diagnosis present

## 2017-05-18 DIAGNOSIS — J440 Chronic obstructive pulmonary disease with acute lower respiratory infection: Secondary | ICD-10-CM | POA: Diagnosis not present

## 2017-05-18 DIAGNOSIS — I509 Heart failure, unspecified: Secondary | ICD-10-CM | POA: Diagnosis not present

## 2017-05-18 DIAGNOSIS — M6281 Muscle weakness (generalized): Secondary | ICD-10-CM | POA: Diagnosis not present

## 2017-05-20 DIAGNOSIS — R0602 Shortness of breath: Secondary | ICD-10-CM | POA: Diagnosis not present

## 2017-05-20 DIAGNOSIS — I509 Heart failure, unspecified: Secondary | ICD-10-CM | POA: Diagnosis not present

## 2017-05-28 DIAGNOSIS — K922 Gastrointestinal hemorrhage, unspecified: Secondary | ICD-10-CM | POA: Diagnosis not present

## 2017-05-28 DIAGNOSIS — I482 Chronic atrial fibrillation: Secondary | ICD-10-CM | POA: Diagnosis not present

## 2017-05-28 DIAGNOSIS — I110 Hypertensive heart disease with heart failure: Secondary | ICD-10-CM | POA: Diagnosis not present

## 2017-05-28 DIAGNOSIS — E119 Type 2 diabetes mellitus without complications: Secondary | ICD-10-CM | POA: Diagnosis not present

## 2017-05-28 DIAGNOSIS — R278 Other lack of coordination: Secondary | ICD-10-CM | POA: Diagnosis not present

## 2017-05-28 DIAGNOSIS — I214 Non-ST elevation (NSTEMI) myocardial infarction: Secondary | ICD-10-CM | POA: Diagnosis not present

## 2017-05-28 DIAGNOSIS — E440 Moderate protein-calorie malnutrition: Secondary | ICD-10-CM | POA: Diagnosis not present

## 2017-05-28 DIAGNOSIS — K2961 Other gastritis with bleeding: Secondary | ICD-10-CM | POA: Diagnosis not present

## 2017-05-28 DIAGNOSIS — M6281 Muscle weakness (generalized): Secondary | ICD-10-CM | POA: Diagnosis not present

## 2017-05-28 DIAGNOSIS — J811 Chronic pulmonary edema: Secondary | ICD-10-CM | POA: Diagnosis not present

## 2017-05-28 DIAGNOSIS — J189 Pneumonia, unspecified organism: Secondary | ICD-10-CM | POA: Diagnosis not present

## 2017-05-28 DIAGNOSIS — I509 Heart failure, unspecified: Secondary | ICD-10-CM | POA: Diagnosis not present

## 2017-05-28 DIAGNOSIS — R2689 Other abnormalities of gait and mobility: Secondary | ICD-10-CM | POA: Diagnosis not present

## 2017-05-28 DIAGNOSIS — M546 Pain in thoracic spine: Secondary | ICD-10-CM | POA: Diagnosis not present

## 2017-05-28 DIAGNOSIS — M545 Low back pain: Secondary | ICD-10-CM | POA: Diagnosis not present

## 2017-05-28 DIAGNOSIS — M109 Gout, unspecified: Secondary | ICD-10-CM | POA: Diagnosis not present

## 2017-05-28 DIAGNOSIS — E785 Hyperlipidemia, unspecified: Secondary | ICD-10-CM | POA: Diagnosis not present

## 2017-05-28 DIAGNOSIS — J440 Chronic obstructive pulmonary disease with acute lower respiratory infection: Secondary | ICD-10-CM | POA: Diagnosis not present

## 2017-05-28 DIAGNOSIS — R339 Retention of urine, unspecified: Secondary | ICD-10-CM | POA: Diagnosis not present

## 2017-05-28 DIAGNOSIS — D649 Anemia, unspecified: Secondary | ICD-10-CM | POA: Diagnosis not present

## 2017-05-28 DIAGNOSIS — J9691 Respiratory failure, unspecified with hypoxia: Secondary | ICD-10-CM | POA: Diagnosis not present

## 2017-05-28 DIAGNOSIS — J168 Pneumonia due to other specified infectious organisms: Secondary | ICD-10-CM | POA: Diagnosis not present

## 2017-06-25 DIAGNOSIS — M6281 Muscle weakness (generalized): Secondary | ICD-10-CM | POA: Diagnosis not present

## 2017-06-25 DIAGNOSIS — I110 Hypertensive heart disease with heart failure: Secondary | ICD-10-CM | POA: Diagnosis not present

## 2017-06-25 DIAGNOSIS — I482 Chronic atrial fibrillation: Secondary | ICD-10-CM | POA: Diagnosis not present

## 2017-06-25 DIAGNOSIS — J440 Chronic obstructive pulmonary disease with acute lower respiratory infection: Secondary | ICD-10-CM | POA: Diagnosis not present

## 2017-06-25 DIAGNOSIS — D649 Anemia, unspecified: Secondary | ICD-10-CM | POA: Diagnosis not present

## 2017-06-25 DIAGNOSIS — I509 Heart failure, unspecified: Secondary | ICD-10-CM | POA: Diagnosis not present

## 2017-06-25 DIAGNOSIS — I214 Non-ST elevation (NSTEMI) myocardial infarction: Secondary | ICD-10-CM | POA: Diagnosis not present

## 2017-06-25 DIAGNOSIS — Z515 Encounter for palliative care: Secondary | ICD-10-CM | POA: Diagnosis not present

## 2017-06-25 DIAGNOSIS — Z66 Do not resuscitate: Secondary | ICD-10-CM | POA: Diagnosis not present

## 2017-06-26 DIAGNOSIS — J440 Chronic obstructive pulmonary disease with acute lower respiratory infection: Secondary | ICD-10-CM | POA: Diagnosis not present

## 2017-06-26 DIAGNOSIS — I110 Hypertensive heart disease with heart failure: Secondary | ICD-10-CM | POA: Diagnosis not present

## 2017-06-26 DIAGNOSIS — I509 Heart failure, unspecified: Secondary | ICD-10-CM | POA: Diagnosis not present

## 2017-06-26 DIAGNOSIS — M6281 Muscle weakness (generalized): Secondary | ICD-10-CM | POA: Diagnosis not present

## 2017-06-26 DIAGNOSIS — I482 Chronic atrial fibrillation: Secondary | ICD-10-CM | POA: Diagnosis not present

## 2017-06-26 DIAGNOSIS — I214 Non-ST elevation (NSTEMI) myocardial infarction: Secondary | ICD-10-CM | POA: Diagnosis not present

## 2017-06-27 DIAGNOSIS — J440 Chronic obstructive pulmonary disease with acute lower respiratory infection: Secondary | ICD-10-CM | POA: Diagnosis not present

## 2017-06-27 DIAGNOSIS — I110 Hypertensive heart disease with heart failure: Secondary | ICD-10-CM | POA: Diagnosis not present

## 2017-06-27 DIAGNOSIS — I482 Chronic atrial fibrillation: Secondary | ICD-10-CM | POA: Diagnosis not present

## 2017-06-27 DIAGNOSIS — I509 Heart failure, unspecified: Secondary | ICD-10-CM | POA: Diagnosis not present

## 2017-06-27 DIAGNOSIS — M6281 Muscle weakness (generalized): Secondary | ICD-10-CM | POA: Diagnosis not present

## 2017-06-27 DIAGNOSIS — I214 Non-ST elevation (NSTEMI) myocardial infarction: Secondary | ICD-10-CM | POA: Diagnosis not present

## 2017-06-28 DIAGNOSIS — M6281 Muscle weakness (generalized): Secondary | ICD-10-CM | POA: Diagnosis not present

## 2017-06-28 DIAGNOSIS — I482 Chronic atrial fibrillation: Secondary | ICD-10-CM | POA: Diagnosis not present

## 2017-06-28 DIAGNOSIS — J440 Chronic obstructive pulmonary disease with acute lower respiratory infection: Secondary | ICD-10-CM | POA: Diagnosis not present

## 2017-06-28 DIAGNOSIS — I110 Hypertensive heart disease with heart failure: Secondary | ICD-10-CM | POA: Diagnosis not present

## 2017-06-28 DIAGNOSIS — I214 Non-ST elevation (NSTEMI) myocardial infarction: Secondary | ICD-10-CM | POA: Diagnosis not present

## 2017-06-28 DIAGNOSIS — I509 Heart failure, unspecified: Secondary | ICD-10-CM | POA: Diagnosis not present

## 2017-06-29 DIAGNOSIS — Z66 Do not resuscitate: Secondary | ICD-10-CM | POA: Diagnosis not present

## 2017-06-29 DIAGNOSIS — D649 Anemia, unspecified: Secondary | ICD-10-CM | POA: Diagnosis not present

## 2017-06-29 DIAGNOSIS — I482 Chronic atrial fibrillation: Secondary | ICD-10-CM | POA: Diagnosis not present

## 2017-06-29 DIAGNOSIS — I509 Heart failure, unspecified: Secondary | ICD-10-CM | POA: Diagnosis not present

## 2017-06-29 DIAGNOSIS — Z515 Encounter for palliative care: Secondary | ICD-10-CM | POA: Diagnosis not present

## 2017-06-29 DIAGNOSIS — M6281 Muscle weakness (generalized): Secondary | ICD-10-CM | POA: Diagnosis not present

## 2017-06-29 DIAGNOSIS — I214 Non-ST elevation (NSTEMI) myocardial infarction: Secondary | ICD-10-CM | POA: Diagnosis not present

## 2017-06-29 DIAGNOSIS — I110 Hypertensive heart disease with heart failure: Secondary | ICD-10-CM | POA: Diagnosis not present

## 2017-06-29 DIAGNOSIS — J440 Chronic obstructive pulmonary disease with acute lower respiratory infection: Secondary | ICD-10-CM | POA: Diagnosis not present

## 2017-06-30 DIAGNOSIS — I214 Non-ST elevation (NSTEMI) myocardial infarction: Secondary | ICD-10-CM | POA: Diagnosis not present

## 2017-06-30 DIAGNOSIS — I509 Heart failure, unspecified: Secondary | ICD-10-CM | POA: Diagnosis not present

## 2017-06-30 DIAGNOSIS — I482 Chronic atrial fibrillation: Secondary | ICD-10-CM | POA: Diagnosis not present

## 2017-06-30 DIAGNOSIS — J440 Chronic obstructive pulmonary disease with acute lower respiratory infection: Secondary | ICD-10-CM | POA: Diagnosis not present

## 2017-06-30 DIAGNOSIS — M6281 Muscle weakness (generalized): Secondary | ICD-10-CM | POA: Diagnosis not present

## 2017-06-30 DIAGNOSIS — I110 Hypertensive heart disease with heart failure: Secondary | ICD-10-CM | POA: Diagnosis not present

## 2017-07-01 DIAGNOSIS — I110 Hypertensive heart disease with heart failure: Secondary | ICD-10-CM | POA: Diagnosis not present

## 2017-07-01 DIAGNOSIS — J440 Chronic obstructive pulmonary disease with acute lower respiratory infection: Secondary | ICD-10-CM | POA: Diagnosis not present

## 2017-07-01 DIAGNOSIS — I482 Chronic atrial fibrillation: Secondary | ICD-10-CM | POA: Diagnosis not present

## 2017-07-01 DIAGNOSIS — M6281 Muscle weakness (generalized): Secondary | ICD-10-CM | POA: Diagnosis not present

## 2017-07-01 DIAGNOSIS — I509 Heart failure, unspecified: Secondary | ICD-10-CM | POA: Diagnosis not present

## 2017-07-01 DIAGNOSIS — I214 Non-ST elevation (NSTEMI) myocardial infarction: Secondary | ICD-10-CM | POA: Diagnosis not present

## 2017-07-30 DEATH — deceased

## 2018-02-05 IMAGING — DX DG CHEST 2V
2 series · 2 of 2 positions shown · non-contrast
Comparison: 12/29/2014

CLINICAL DATA: Shortness of breath for 2 days

EXAM:
CHEST  2 VIEW

[chest ap]
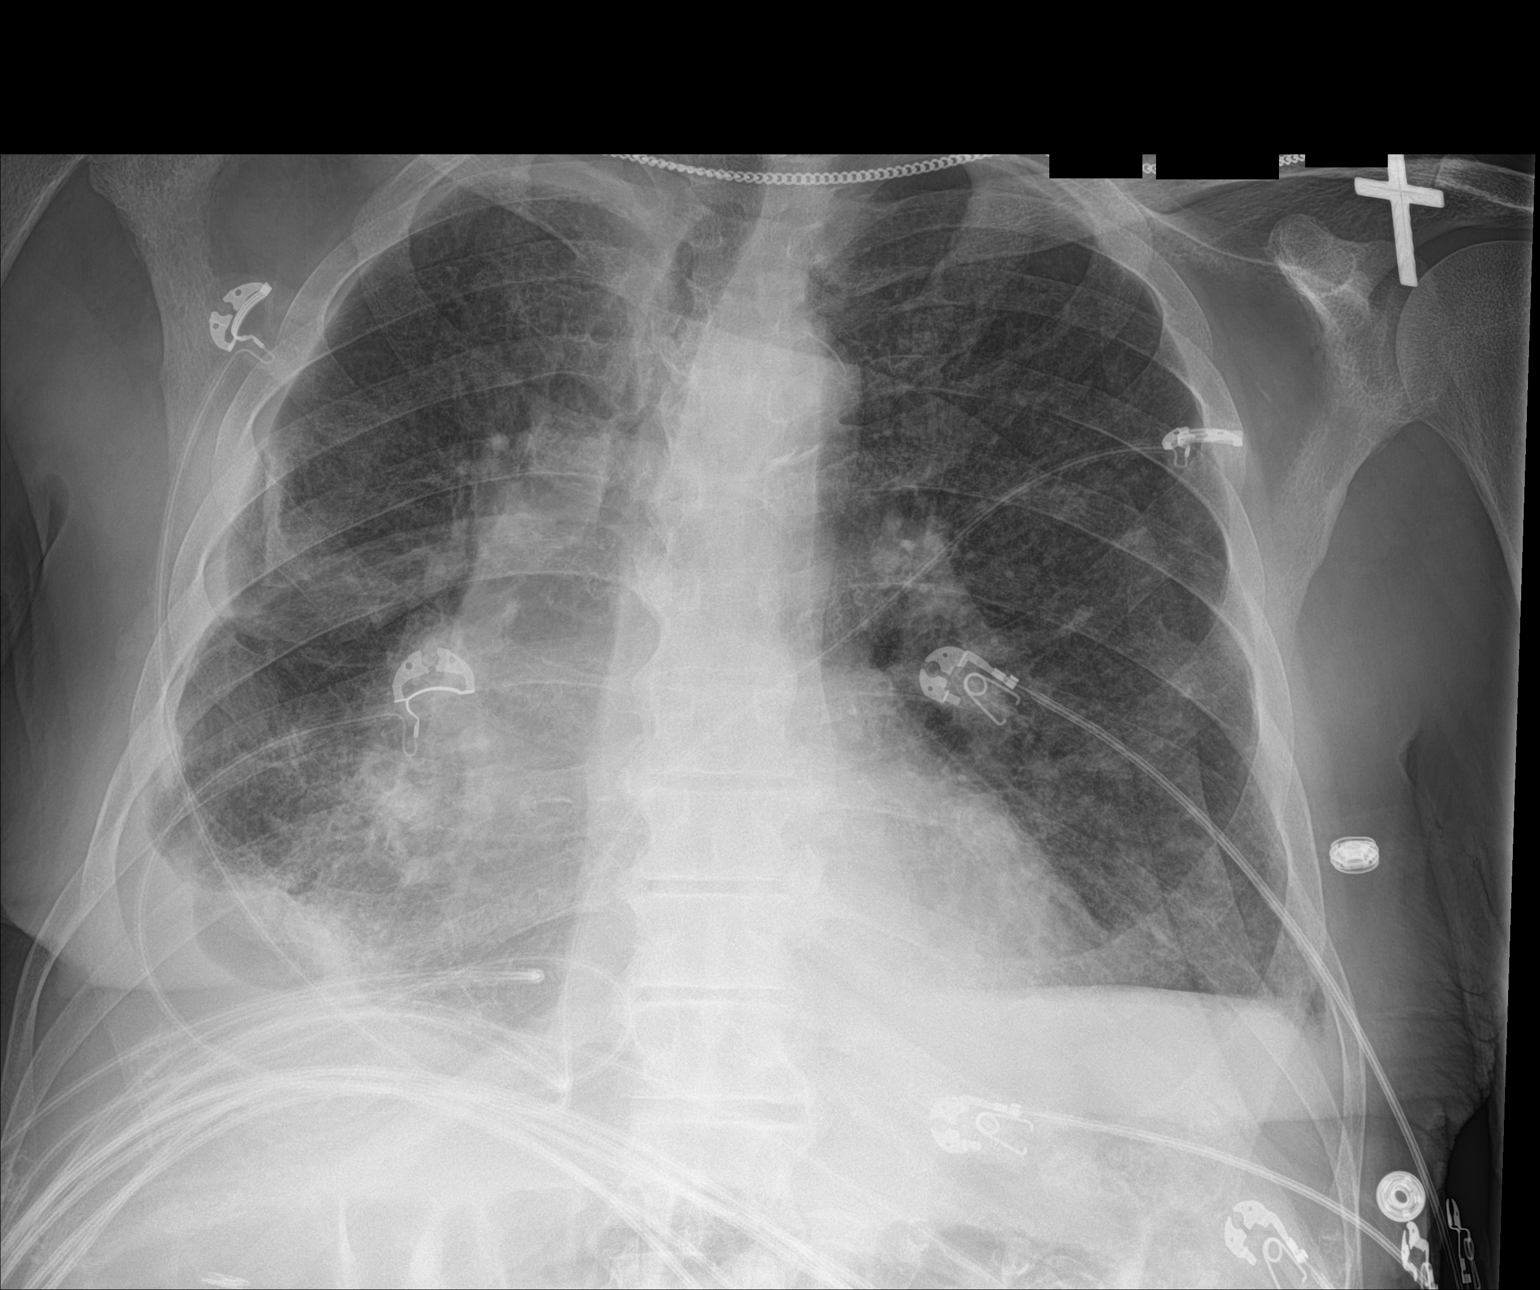

[chest lat]
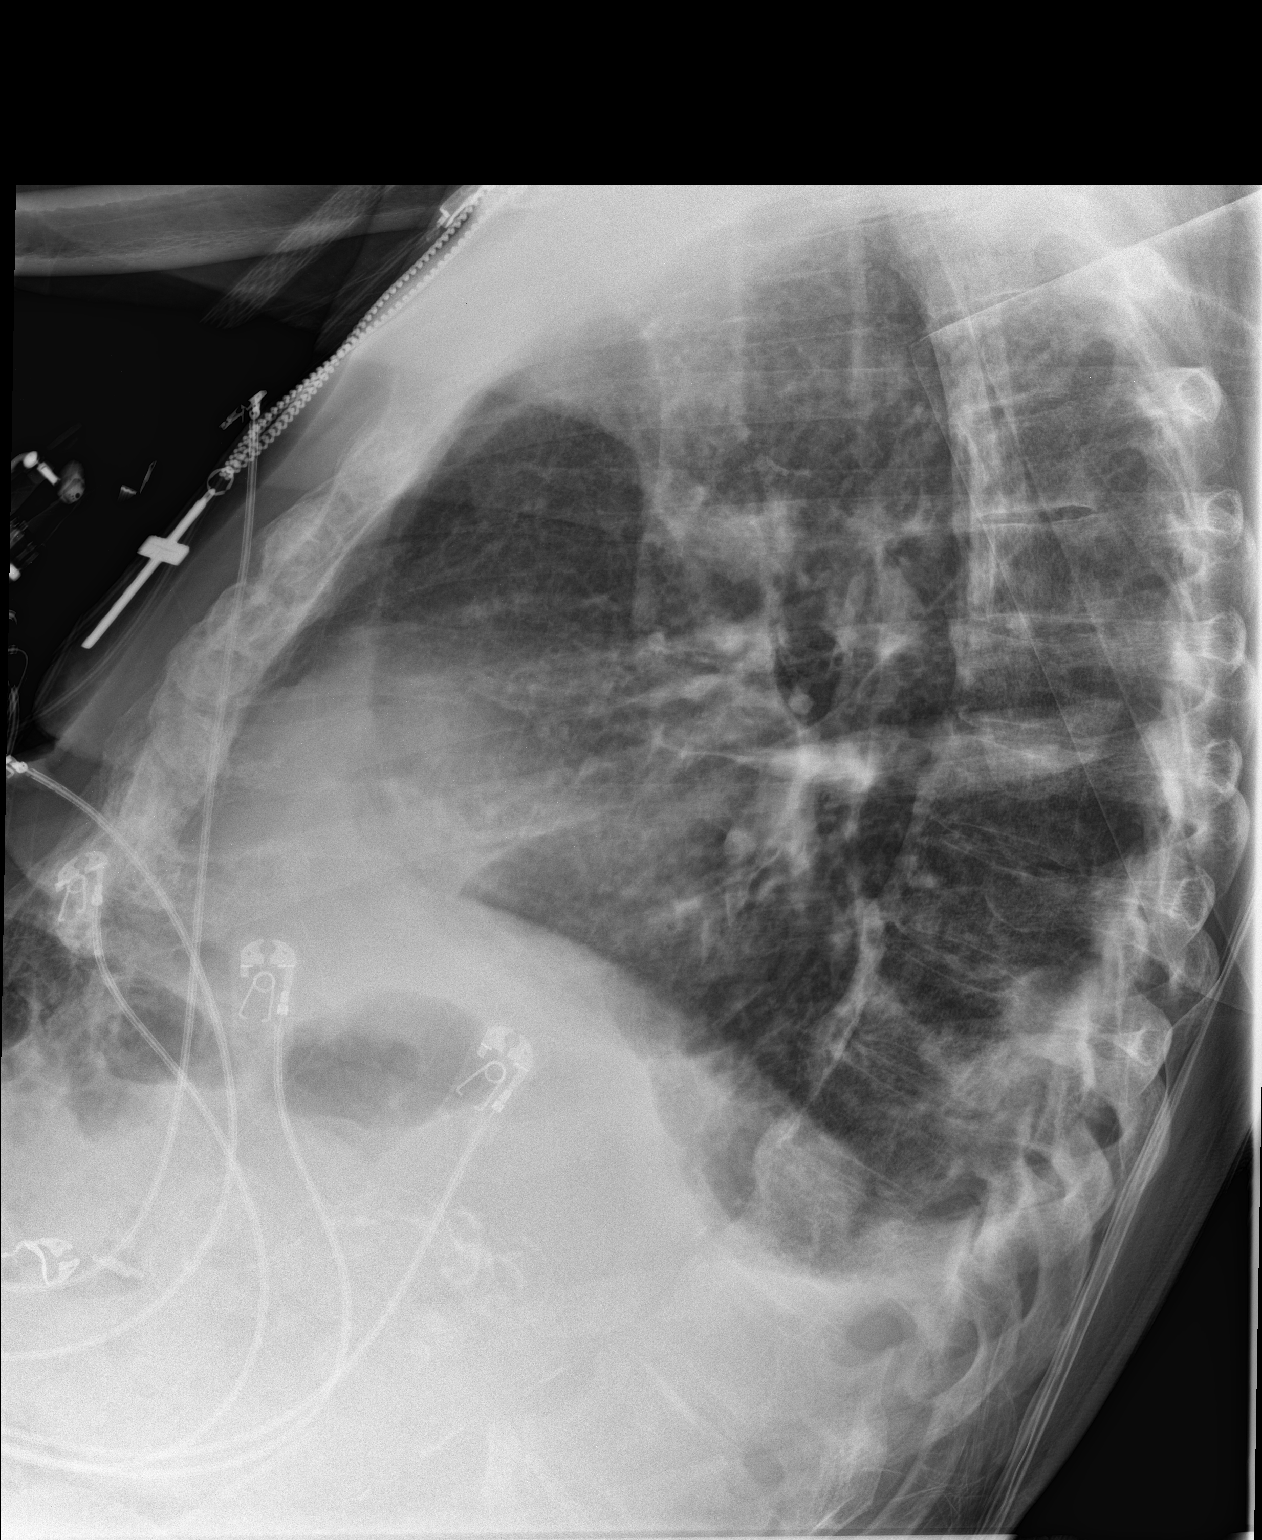

[2 of 2 positions shown; findings below may reference images not displayed]

FINDINGS: Cardiomegaly. Chronic right pleural effusion or pleural thickening
with chronic density at the right lung base, unchanged since prior
study.
IMPRESSION: Stable chronic right pleural thickening or pleural effusion with
right base atelectasis or scarring.

Stable cardiomegaly.
